# Patient Record
Sex: Male | Born: 1950 | Race: Black or African American | Hispanic: No | Marital: Married | Smoking: Never smoker
Health system: Southern US, Community
[De-identification: ages and names within clinical notes are randomized; demographics above are authoritative.]

## PROBLEM LIST (undated history)

## (undated) DIAGNOSIS — F419 Anxiety disorder, unspecified: Secondary | ICD-10-CM

## (undated) DIAGNOSIS — F4310 Post-traumatic stress disorder, unspecified: Secondary | ICD-10-CM

## (undated) DIAGNOSIS — E119 Type 2 diabetes mellitus without complications: Secondary | ICD-10-CM

## (undated) DIAGNOSIS — M549 Dorsalgia, unspecified: Secondary | ICD-10-CM

## (undated) DIAGNOSIS — I10 Essential (primary) hypertension: Secondary | ICD-10-CM

## (undated) HISTORY — PX: COLONOSCOPY: SHX174

## (undated) HISTORY — PX: EYE SURGERY: SHX253

---

## 2007-06-27 ENCOUNTER — Emergency Department (HOSPITAL_COMMUNITY): Admission: EM | Admit: 2007-06-27 | Discharge: 2007-06-27 | Payer: Self-pay | Admitting: Emergency Medicine

## 2011-02-19 ENCOUNTER — Emergency Department (HOSPITAL_COMMUNITY)
Admission: EM | Admit: 2011-02-19 | Discharge: 2011-02-19 | Disposition: A | Discharge status: Home / Self Care | Payer: Medicare Other | Attending: Emergency Medicine | Admitting: Emergency Medicine

## 2011-02-19 DIAGNOSIS — E119 Type 2 diabetes mellitus without complications: Secondary | ICD-10-CM | POA: Insufficient documentation

## 2011-02-19 DIAGNOSIS — R5381 Other malaise: Secondary | ICD-10-CM | POA: Insufficient documentation

## 2011-02-19 DIAGNOSIS — R55 Syncope and collapse: Secondary | ICD-10-CM | POA: Insufficient documentation

## 2011-02-19 DIAGNOSIS — I10 Essential (primary) hypertension: Secondary | ICD-10-CM | POA: Insufficient documentation

## 2011-02-19 DIAGNOSIS — R42 Dizziness and giddiness: Secondary | ICD-10-CM | POA: Insufficient documentation

## 2011-02-19 DIAGNOSIS — R5383 Other fatigue: Secondary | ICD-10-CM | POA: Insufficient documentation

## 2011-02-19 DIAGNOSIS — Z79899 Other long term (current) drug therapy: Secondary | ICD-10-CM | POA: Insufficient documentation

## 2019-10-03 ENCOUNTER — Emergency Department (HOSPITAL_COMMUNITY): Payer: Medicare Other

## 2019-10-03 ENCOUNTER — Emergency Department (HOSPITAL_COMMUNITY)
Admission: EM | Admit: 2019-10-03 | Discharge: 2019-10-03 | Disposition: A | Discharge status: Home / Self Care | Payer: Medicare Other | Attending: Emergency Medicine | Admitting: Emergency Medicine

## 2019-10-03 DIAGNOSIS — Y93I9 Activity, other involving external motion: Secondary | ICD-10-CM | POA: Diagnosis not present

## 2019-10-03 DIAGNOSIS — Y92411 Interstate highway as the place of occurrence of the external cause: Secondary | ICD-10-CM | POA: Diagnosis not present

## 2019-10-03 DIAGNOSIS — Y999 Unspecified external cause status: Secondary | ICD-10-CM | POA: Diagnosis not present

## 2019-10-03 DIAGNOSIS — S161XXA Strain of muscle, fascia and tendon at neck level, initial encounter: Secondary | ICD-10-CM

## 2019-10-03 DIAGNOSIS — M479 Spondylosis, unspecified: Secondary | ICD-10-CM | POA: Insufficient documentation

## 2019-10-03 DIAGNOSIS — M7918 Myalgia, other site: Secondary | ICD-10-CM

## 2019-10-03 DIAGNOSIS — S199XXA Unspecified injury of neck, initial encounter: Secondary | ICD-10-CM | POA: Diagnosis present
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
## 2019-10-03 MED ORDER — LIDOCAINE 5 % EX PTCH: 1.0000 | MEDICATED_PATCH | CUTANEOUS | 0 refills | Status: DC

## 2019-10-03 MED ORDER — HYDROCODONE-ACETAMINOPHEN 5-325 MG PO TABS: 1.0000 | ORAL_TABLET | ORAL | 0 refills | Status: DC | PRN

## 2019-10-03 NOTE — Discharge Instructions (Addendum)
Warm compresses to sore neck and back for 30 minutes at a time. Apply Lidoderm patches to back as needed as directed for pain. Recommend light activity today. Take Norco as needed as prescribed for pain, do not drive or operate machinery if taking Norco. Take Colace if you take Norco to prevent constipation. Follow-up with your doctor on Monday, return to ER for worsening or concerning symptoms.
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
## 2019-10-23 ENCOUNTER — Other Ambulatory Visit: Payer: Self-pay

## 2019-10-23 ENCOUNTER — Ambulatory Visit (INDEPENDENT_AMBULATORY_CARE_PROVIDER_SITE_OTHER): Payer: Medicare Other | Admitting: Family Medicine

## 2019-10-23 ENCOUNTER — Encounter: Payer: Self-pay | Admitting: Family Medicine

## 2019-10-23 DIAGNOSIS — M546 Pain in thoracic spine: Secondary | ICD-10-CM | POA: Diagnosis not present

## 2019-10-23 DIAGNOSIS — M542 Cervicalgia: Secondary | ICD-10-CM | POA: Diagnosis not present

## 2019-10-23 DIAGNOSIS — M545 Low back pain, unspecified: Secondary | ICD-10-CM
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
## 2019-11-04 ENCOUNTER — Ambulatory Visit (HOSPITAL_COMMUNITY)
Admission: RE | Admit: 2019-11-04 | Discharge: 2019-11-04 | Disposition: A | Discharge status: Home / Self Care | Payer: Medicare Other | Source: Ambulatory Visit | Attending: Family Medicine | Admitting: Family Medicine

## 2019-11-04 ENCOUNTER — Telehealth: Payer: Self-pay

## 2019-11-04 ENCOUNTER — Telehealth: Payer: Self-pay | Admitting: *Deleted

## 2019-11-04 ENCOUNTER — Other Ambulatory Visit: Payer: Self-pay

## 2019-11-04 ENCOUNTER — Telehealth: Payer: Self-pay | Admitting: Family Medicine

## 2019-11-04 DIAGNOSIS — M545 Low back pain, unspecified: Secondary | ICD-10-CM

## 2019-11-04 DIAGNOSIS — R29898 Other symptoms and signs involving the musculoskeletal system: Secondary | ICD-10-CM

## 2019-11-04 IMAGING — MR MR LUMBAR SPINE W/O CM
4 of 5 series · 19 of 48 positions shown · non-contrast
Comparison: None.

CLINICAL DATA: Back pain, minor trauma

EXAM:
MRI LUMBAR SPINE WITHOUT CONTRAST
TECHNIQUE: Multiplanar, multisequence MR imaging of the lumbar spine was
performed. No intravenous contrast was administered.

[Series 2: T2 · sagittal · 4.0mm · 0.55mm/px · 5 of 14 slices shown (1 of 2)]
[im 1/14]
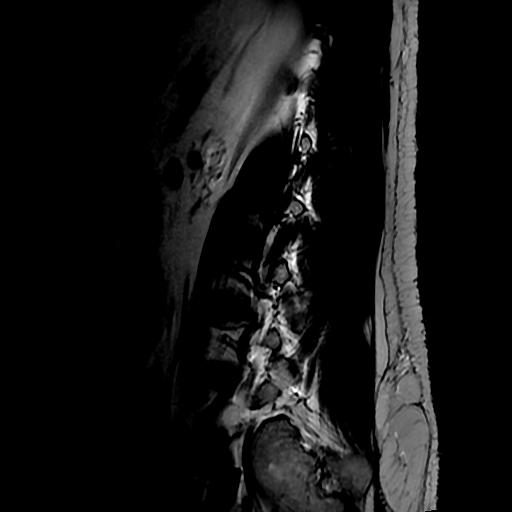
[im 4/14]
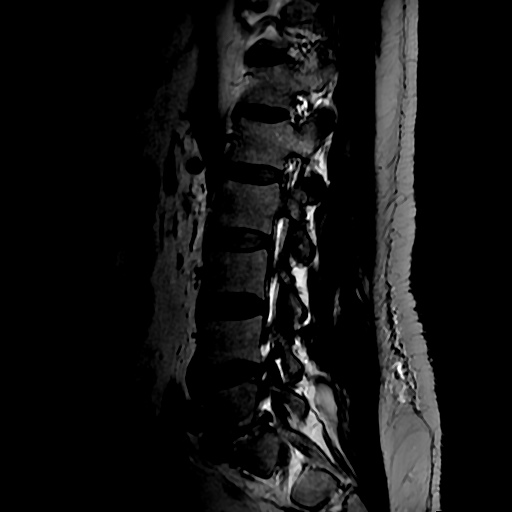
[im 7/14]
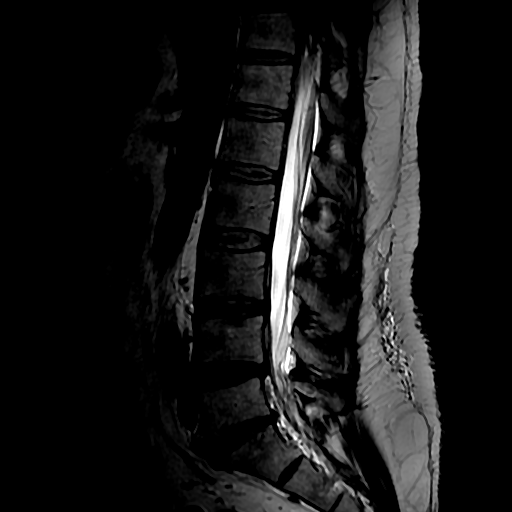
[im 10/14]
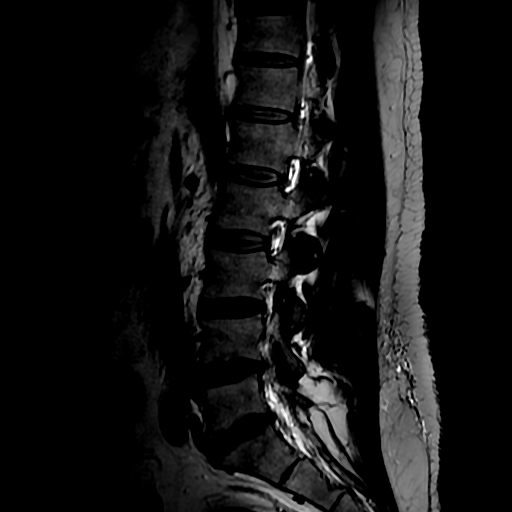
[im 14/14]
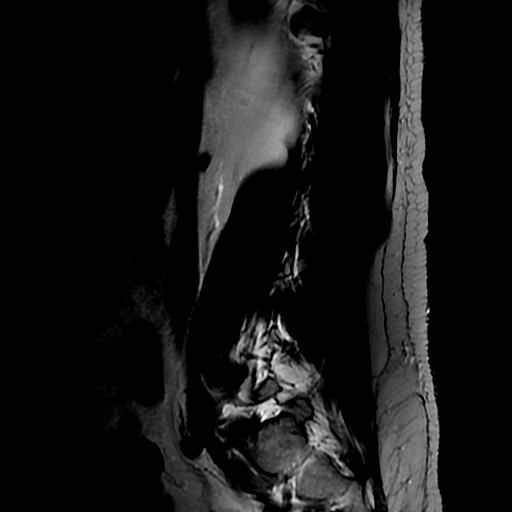

[Series 4: T1 · sagittal · 4.0mm · 0.55mm/px · 3 of 14 slices shown (1 of 2)]
[im 3/14]
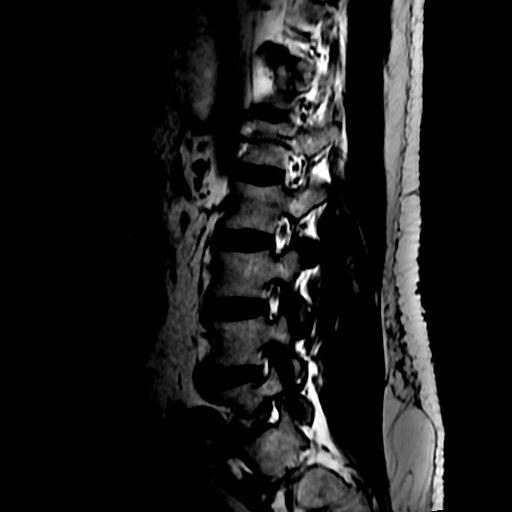
[im 8/14]
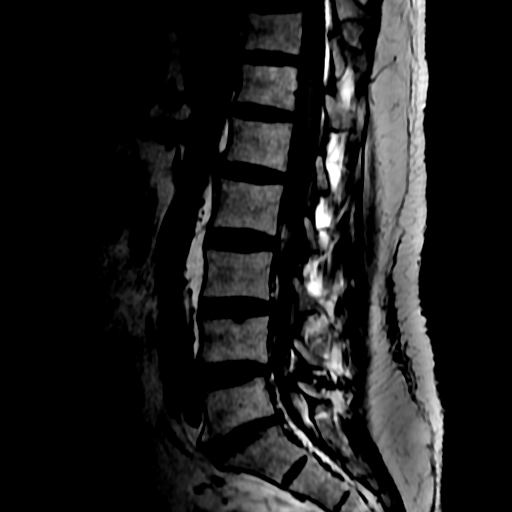
[im 14/14]
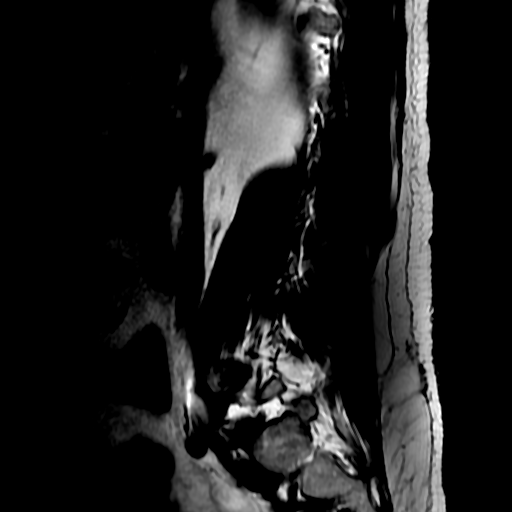

[Series 5: T2 · axial · 4.0mm · 0.39mm/px · z∈[-145,+30]mm · 8 of 40 slices shown (2 of 2)]
[im 3/40]
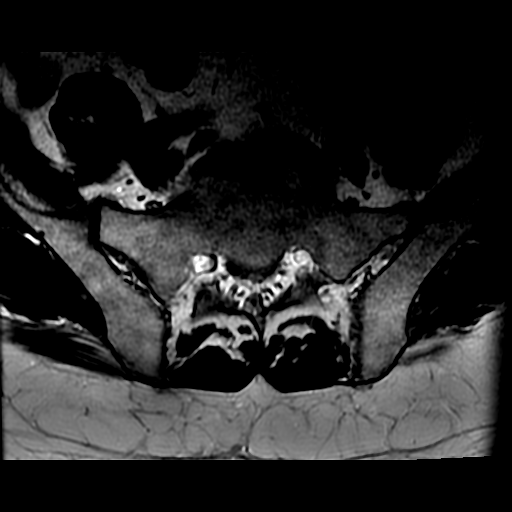
[im 6/40]
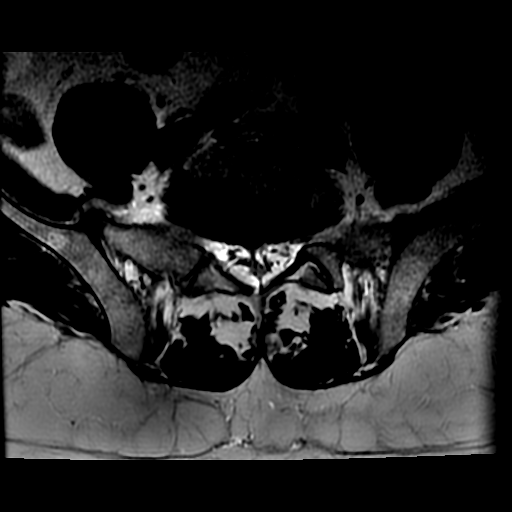
[im 8/40]
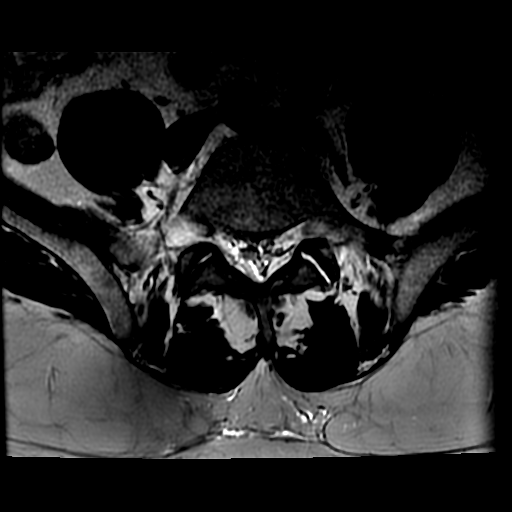
[im 14/40]
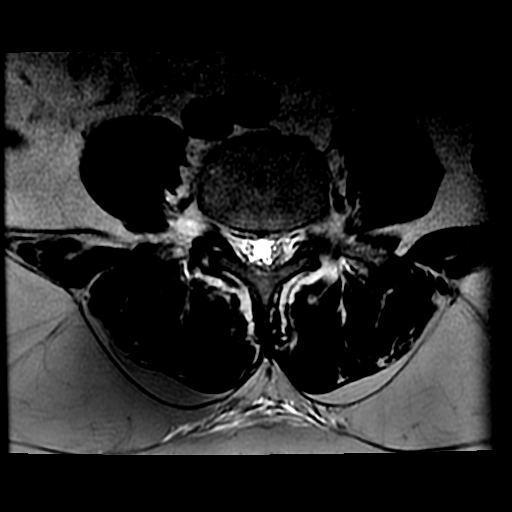
[im 19/40]
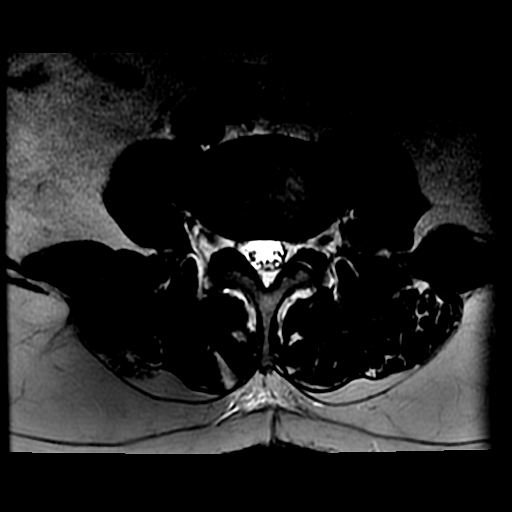
[im 21/40]
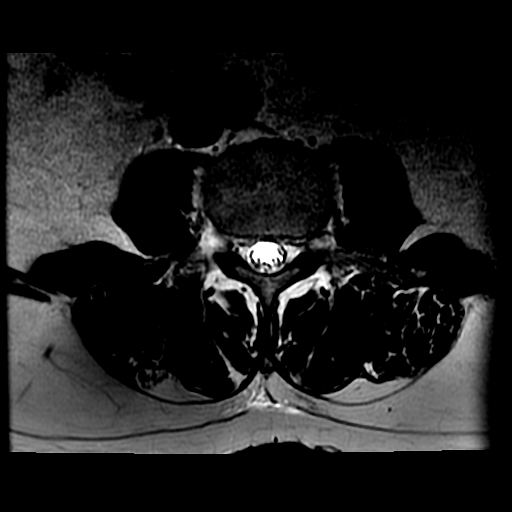
[im 24/40]
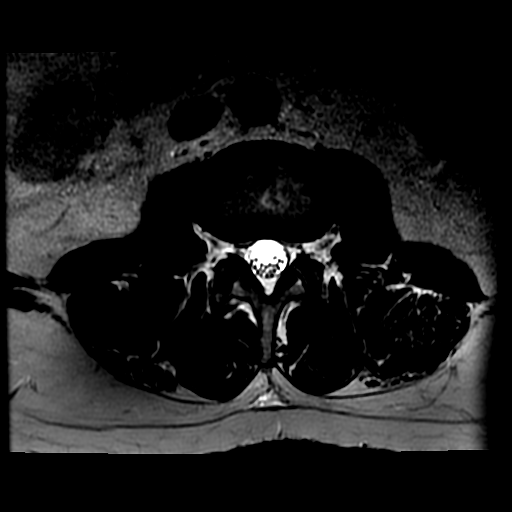
[im 34/40]
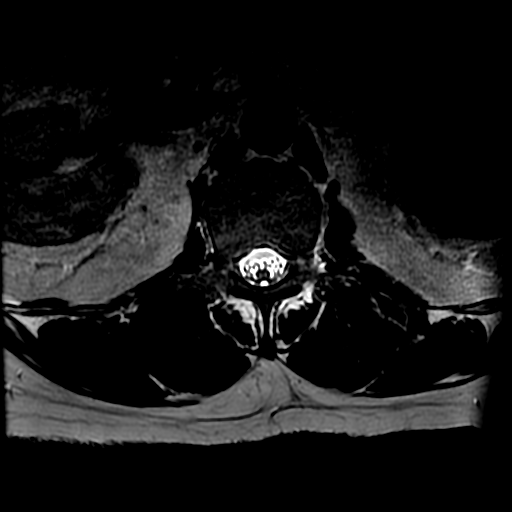

[Series 6: T1 · axial · 4.0mm · 0.39mm/px · z∈[-130,+30]mm · 3 of 40 slices shown (2 of 2)]
[im 6/40]
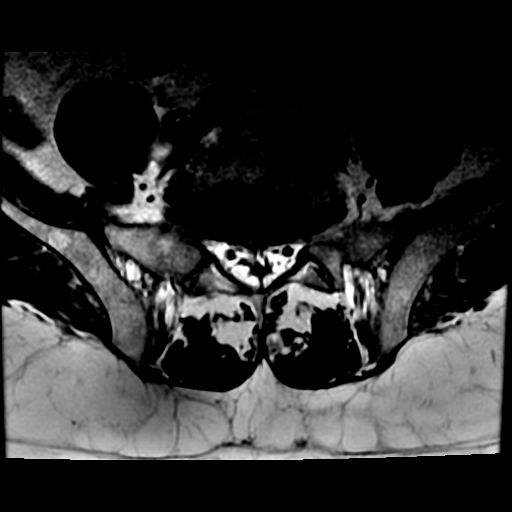
[im 21/40]
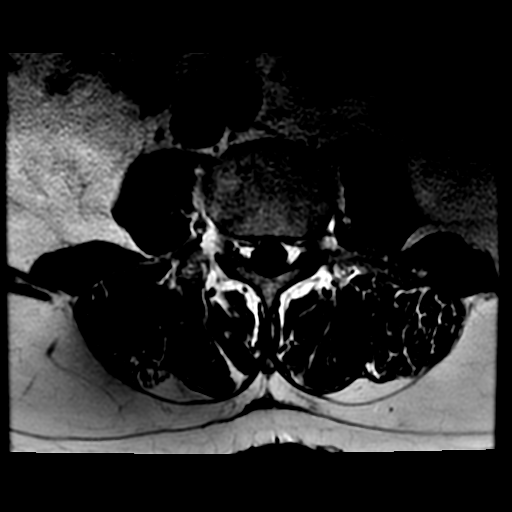
[im 34/40]
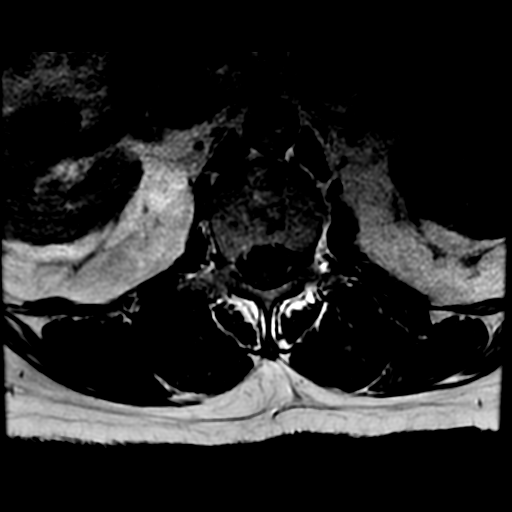

[19 of 48 positions shown; findings below may reference images not displayed]

FINDINGS: Segmentation:  Standard.

Alignment: Straightening of the lumbar lordosis. Anteroposterior
alignment is maintained.

Vertebrae: Vertebral body heights are maintained apart from
degenerative endplate irregularity at L4 and L5. There is no
significant marrow edema. No suspicious osseous lesion.

Conus medullaris and cauda equina: Conus extends to the L1-L2 level.
Conus and cauda equina appear normal.

Paraspinal and other soft tissues: Unremarkable.

Disc levels:

L1-L2:  No canal or foraminal stenosis.

L2-L3:  No canal or foraminal stenosis.

L3-L4:  No canal or foraminal stenosis.

L4-L5: Disc desiccation and mild disc height loss. Disc bulge and
mild facet arthropathy. No canal stenosis. Mild to moderate right
and mild left foraminal stenosis.

L5-S1: Disc desiccation and moderate disc height loss. Disc bulge
with endplate osteophytic ridging. Prominent circumferential
epidural fat partially effacing the thecal sac. No significant right
foraminal stenosis. Mild to moderate left foraminal stenosis.
IMPRESSION: Lower lumbar degenerative changes as detailed above.

## 2019-11-04 NOTE — Telephone Encounter (Signed)
I called the patient - see other message on this from today. 

## 2019-11-04 NOTE — Telephone Encounter (Signed)
FYI: Patient called again so I made his appt for MRI Review for Friday morning w/Dr Hilts. Please disregard last message until appt date. Thanks

## 2019-11-04 NOTE — Telephone Encounter (Signed)
I called the patient about his MRI results - see other message on this from today. I cancelled the appointment, as it is no longer needed.

## 2019-11-04 NOTE — Telephone Encounter (Signed)
I called the patient with his MRI results. He will continue chiropractic care and wait for a call with an appointment with neurology for NCS on his legs. Will cancel Friday's appointment with Dr. Junius Roads, as his MRI information was given today.

## 2019-11-04 NOTE — Telephone Encounter (Signed)
MRI shows some degenerative changes in the spine, but no pinched nerves.  This is surprising given his leg weakness, so I am going to refer him to neurology for nerve studies on his legs.

## 2019-11-04 NOTE — Telephone Encounter (Signed)
Patient called and wants someone to call him urgently, today. Patient states he needs to know what Dr Junius Roads want him to do. I advised patient that I could make him an appt for MRI Review since it was taken today. Patient would like for Dr Junius Roads to call him to know what to do from this point. Please advise.

## 2019-11-06 ENCOUNTER — Ambulatory Visit: Payer: Medicare Other | Admitting: Family Medicine

## 2019-11-06 ENCOUNTER — Other Ambulatory Visit: Payer: Self-pay

## 2019-11-06 DIAGNOSIS — R202 Paresthesia of skin: Secondary | ICD-10-CM

## 2019-11-09 ENCOUNTER — Telehealth: Payer: Self-pay | Admitting: Neurology

## 2019-11-09 NOTE — Telephone Encounter (Signed)
This is for your review, pt is scheduled for EMG tomorrow

## 2019-11-09 NOTE — Telephone Encounter (Signed)
Patient is calling in with the information for his doctor you needed. He said it's Dr. Luretha Rued at Phone [phone] and Fax [phone]. This is in regards to his steroid injection. Thanks!

## 2019-11-09 NOTE — Telephone Encounter (Signed)
Patient needs to share this with Dr. Junius Roads.  We are only performing the EMG.

## 2019-11-10 ENCOUNTER — Ambulatory Visit (INDEPENDENT_AMBULATORY_CARE_PROVIDER_SITE_OTHER): Payer: Medicare Other | Admitting: Neurology

## 2019-11-10 ENCOUNTER — Telehealth: Payer: Self-pay | Admitting: Family Medicine

## 2019-11-10 ENCOUNTER — Other Ambulatory Visit: Payer: Self-pay

## 2019-11-10 DIAGNOSIS — R202 Paresthesia of skin: Secondary | ICD-10-CM | POA: Diagnosis not present

## 2019-11-10 DIAGNOSIS — M5417 Radiculopathy, lumbosacral region: Secondary | ICD-10-CM

## 2019-11-10 NOTE — Telephone Encounter (Signed)
Will share with patient at EMG procedure today

## 2019-11-10 NOTE — Telephone Encounter (Signed)
Patient called to let Dr. Junius Roads know the name of the steroid injection in his back was called Dexamethasone.  CB#[phone].  Thank you.

## 2019-11-10 NOTE — Telephone Encounter (Signed)
Ok, thank you

## 2019-11-11 ENCOUNTER — Encounter: Payer: Self-pay | Admitting: Family Medicine

## 2019-11-11 ENCOUNTER — Encounter: Payer: Medicare Other | Admitting: Neurology

## 2019-11-11 ENCOUNTER — Telehealth: Payer: Self-pay

## 2019-11-11 ENCOUNTER — Ambulatory Visit (INDEPENDENT_AMBULATORY_CARE_PROVIDER_SITE_OTHER): Payer: Medicare Other | Admitting: Family Medicine

## 2019-11-11 DIAGNOSIS — M545 Low back pain, unspecified: Secondary | ICD-10-CM

## 2019-11-11 NOTE — Telephone Encounter (Signed)
Patient did come in for his appointment today.
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
## 2019-11-11 NOTE — Telephone Encounter (Signed)
I called and left a message on the patient's voice mail that we do not have the results from yesterday's nerve conduction studies yet (from Dr. Posey Pronto). There is no need for this afternoon's visit with Dr. Junius Roads, unless there is something else for which he would like to see him. I asked him to give me a call back, so I would know if I need to cancel this appointment or keep it.
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
## 2019-11-19 ENCOUNTER — Encounter: Payer: Self-pay | Admitting: *Deleted

## 2019-12-07 ENCOUNTER — Ambulatory Visit (INDEPENDENT_AMBULATORY_CARE_PROVIDER_SITE_OTHER): Payer: Medicare Other | Admitting: Physical Medicine and Rehabilitation

## 2019-12-07 ENCOUNTER — Encounter: Payer: Self-pay | Admitting: Physical Medicine and Rehabilitation

## 2019-12-07 ENCOUNTER — Other Ambulatory Visit: Payer: Self-pay

## 2019-12-07 ENCOUNTER — Ambulatory Visit: Payer: Self-pay

## 2019-12-07 VITALS — BP 157/101 | HR 66

## 2019-12-07 DIAGNOSIS — M5416 Radiculopathy, lumbar region: Secondary | ICD-10-CM

## 2019-12-07 MED ORDER — DEXAMETHASONE SODIUM PHOSPHATE 10 MG/ML IJ SOLN
10.0000 mg | Freq: Once | INTRAMUSCULAR | Status: AC
  Administered 2019-12-07: 16:00:00 10 mg

## 2019-12-07 MED ORDER — BETAMETHASONE SOD PHOS & ACET 6 (3-3) MG/ML IJ SUSP
12.0000 mg | Freq: Once | INTRAMUSCULAR | Status: AC
  Administered 2019-12-07: 08:00:00 12 mg

## 2019-12-07 NOTE — Progress Notes (Signed)
Numeric Pain Rating Scale and Functional Assessment Average Pain 10   In the last MONTH (on 0-10 scale) has pain interfered with the following?  1. General activity like being  able to carry out your everyday physical activities such as walking, climbing stairs, carrying groceries, or moving a chair?  Rating(10)   +Driver, -BT, -Dye Allergies. 
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
## 2019-12-07 NOTE — Addendum Note (Signed)
Addended by: Raymondo Band on: 12/07/2019 03:57 PM   Modules accepted: Orders, SmartSet

## 2019-12-07 NOTE — Procedures (Signed)
S1 Lumbosacral Transforaminal Epidural Steroid Injection - Sub-Pedicular Approach with Fluoroscopic Guidance   Patient: Tony [name]      Date of Birth: [birth year] MRN: [id number] PCP: System, Pcp Not In      Visit Date: 12/07/2019   Universal Protocol:    Date/Time: 12/21/208:18 AM  Consent Given By: the patient  Position:  PRONE  Additional Comments: Vital signs were monitored before and after the procedure. Patient was prepped and draped in the usual sterile fashion. The correct patient, procedure, and site was verified.   Injection Procedure Details:  Procedure Site One Meds Administered:  Meds ordered this encounter  Medications  . betamethasone acetate-betamethasone sodium phosphate (CELESTONE) injection 12 mg    Laterality: Left  Location/Site:  S1 Foramen   Needle size: 22 ga.  Needle type: Spinal  Needle Placement: Transforaminal  Findings:   -Comments: Excellent flow of contrast along the nerve and into the epidural space.  Epidurogram: Contrast epidurogram showed no nerve root cut off or restricted flow pattern.  Procedure Details: After squaring off the sacral end-plate to get a true AP view, the C-arm was positioned so that the best possible view of the S1 foramen was visualized. The soft tissues overlying this structure were infiltrated with 2-3 ml. of 1% Lidocaine without Epinephrine.    The spinal needle was inserted toward the target using a "trajectory" view along the fluoroscope beam.  Under AP and lateral visualization, the needle was advanced so it did not puncture dura. Biplanar projections were used to confirm position. Aspiration was confirmed to be negative for CSF and/or blood. A 1-2 ml. volume of Isovue-250 was injected and flow of contrast was noted at each level. Radiographs were obtained for documentation purposes.   After attaining the desired flow of contrast documented above, a 0.5 to 1.0 ml test dose of 0.25% Marcaine was injected  into each respective transforaminal space.  The patient was observed for 90 seconds post injection.  After no sensory deficits were reported, and normal lower extremity motor function was noted,   the above injectate was administered so that equal amounts of the injectate were placed at each foramen (level) into the transforaminal epidural space.   Additional Comments:  The patient tolerated the procedure well Dressing: Band-Aid with 2 x 2 sterile gauze    Post-procedure details: Patient was observed during the procedure. Post-procedure instructions were reviewed.  Patient left the clinic in stable condition.

## 2019-12-21 ENCOUNTER — Ambulatory Visit: Payer: Medicare Other | Admitting: Family Medicine

## 2019-12-22 ENCOUNTER — Other Ambulatory Visit: Payer: Self-pay

## 2019-12-22 ENCOUNTER — Ambulatory Visit (INDEPENDENT_AMBULATORY_CARE_PROVIDER_SITE_OTHER): Payer: Medicare Other | Admitting: Family Medicine

## 2019-12-22 ENCOUNTER — Encounter: Payer: Self-pay | Admitting: Family Medicine

## 2019-12-22 DIAGNOSIS — M79642 Pain in left hand: Secondary | ICD-10-CM

## 2019-12-22 DIAGNOSIS — M545 Low back pain, unspecified: Secondary | ICD-10-CM

## 2019-12-22 DIAGNOSIS — R29898 Other symptoms and signs involving the musculoskeletal system: Secondary | ICD-10-CM

## 2019-12-22 DIAGNOSIS — F4310 Post-traumatic stress disorder, unspecified: Secondary | ICD-10-CM

## 2019-12-22 MED ORDER — HYDROCODONE-ACETAMINOPHEN 5-325 MG PO TABS: 1.0000 | ORAL_TABLET | ORAL | 0 refills | Status: DC | PRN

## 2019-12-22 MED ORDER — LIDOCAINE 5 % EX PTCH: 1.0000 | MEDICATED_PATCH | CUTANEOUS | 6 refills | Status: DC
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
## 2019-12-28 ENCOUNTER — Encounter: Payer: Self-pay | Admitting: *Deleted

## 2019-12-30 ENCOUNTER — Ambulatory Visit
Admission: RE | Admit: 2019-12-30 | Discharge: 2019-12-30 | Disposition: A | Discharge status: Home / Self Care | Payer: Medicare Other | Source: Ambulatory Visit | Attending: Family Medicine | Admitting: Family Medicine

## 2019-12-30 ENCOUNTER — Other Ambulatory Visit: Payer: Self-pay

## 2019-12-30 ENCOUNTER — Telehealth: Payer: Self-pay | Admitting: Family Medicine

## 2019-12-30 DIAGNOSIS — M79642 Pain in left hand: Secondary | ICD-10-CM
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
## 2019-12-30 NOTE — Telephone Encounter (Signed)
I advised the patient of his MRI results. He would still like to come in and see Dr. Prince Rome in the morning, anyway. I will give him his hand therapy referral at that time.

## 2019-12-30 NOTE — Telephone Encounter (Signed)
I reviewed his left thumb MRI scan which looks good, no sign of ligament tear or occult fracture.  I will recommend referral to hand therapy.

## 2019-12-31 ENCOUNTER — Ambulatory Visit (INDEPENDENT_AMBULATORY_CARE_PROVIDER_SITE_OTHER): Payer: Medicare Other | Admitting: Family Medicine

## 2019-12-31 ENCOUNTER — Encounter: Payer: Self-pay | Admitting: Family Medicine

## 2019-12-31 DIAGNOSIS — S63602D Unspecified sprain of left thumb, subsequent encounter: Secondary | ICD-10-CM | POA: Diagnosis not present
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
## 2020-01-05 ENCOUNTER — Encounter: Payer: Self-pay | Admitting: Physical Medicine and Rehabilitation

## 2020-01-05 ENCOUNTER — Ambulatory Visit: Payer: Self-pay

## 2020-01-05 ENCOUNTER — Other Ambulatory Visit: Payer: Self-pay

## 2020-01-05 ENCOUNTER — Ambulatory Visit (INDEPENDENT_AMBULATORY_CARE_PROVIDER_SITE_OTHER): Payer: Medicare Other | Admitting: Physical Medicine and Rehabilitation

## 2020-01-05 VITALS — BP 162/95 | HR 83

## 2020-01-05 DIAGNOSIS — M5116 Intervertebral disc disorders with radiculopathy, lumbar region: Secondary | ICD-10-CM | POA: Diagnosis not present

## 2020-01-05 DIAGNOSIS — M5416 Radiculopathy, lumbar region: Secondary | ICD-10-CM

## 2020-01-05 MED ORDER — DEXAMETHASONE SODIUM PHOSPHATE 10 MG/ML IJ SOLN
15.0000 mg | Freq: Once | INTRAMUSCULAR | Status: AC
  Administered 2020-01-05: 14:00:00 15 mg

## 2020-01-05 NOTE — Progress Notes (Signed)
Numeric Pain Rating Scale and Functional Assessment Average Pain 10   In the last MONTH (on 0-10 scale) has pain interfered with the following?  1. General activity like being  able to carry out your everyday physical activities such as walking, climbing stairs, carrying groceries, or moving a chair?  Rating(10)   +Driver, -BT, -Dye Allergies. 

## 2020-01-06 NOTE — Progress Notes (Signed)
Tony [name] - 69 y.o. male MRN [id number]  Date of birth: [birth year]  Office Visit Note: Visit Date: 01/05/2020 PCP: System, Pcp Not In Referred by: [name], MD  Subjective: Chief Complaint  Patient presents with  . Lower Back - Pain  . Left Leg - Pain   HPI: Tony [name] is a 69 y.o. male who comes in today For planned repeat left S1 transforaminal epidural steroid injection.  Patient is followed by Dr. [name].  He still continues to have 10 out of 10 low back and left radicular leg pain down the back of the leg.  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  Electrodiagnostic study by Dr. Narda Amber showed chronic S1 radiculopathy.  Patient has received a lot of pain management care in [place] where he lives part of the time.  Last injection did seem to help but it was very short-lived.  I will repeat the injection today.  He request dexamethasone.  He is an insulin-dependent diabetic and we did counsel him on increased blood sugar.  If this injection is not long-lasting I am not sure I have anything else from an injection standpoint to offer particularly with epidural lipomatosis.  I have seen insulin-dependent diabetics get this increased accumulation of epidural fat tissue and almost a lipodystrophy.  Encourage weight loss may be have to consider surgical approach.  ROS Otherwise per HPI.  Assessment & Plan: Visit Diagnoses:  1. Lumbar radiculopathy   2. Radiculopathy due to lumbar intervertebral disc disorder     Plan: No additional findings.   Meds & Orders:  Meds ordered this encounter  Medications  . dexamethasone (DECADRON) injection 15 mg    Orders Placed This Encounter  Procedures  . XR C-ARM NO REPORT  . Epidural Steroid injection    Follow-up: Return for visit to requesting physician as needed.   Procedures: No procedures  performed  S1 Lumbosacral Transforaminal Epidural Steroid Injection - Sub-Pedicular Approach with Fluoroscopic Guidance   Patient: Tony [name]      Date of Birth: Apr 05, 1951 MRN: [id number] PCP: System, Pcp Not In      Visit Date: 01/05/2020   Universal Protocol:    Date/Time: 01/20/215:55 AM  Consent Given By: the patient  Position:  PRONE  Additional Comments: Vital signs were monitored before and after the procedure. Patient was prepped and draped in the usual sterile fashion. The correct patient, procedure, and site was verified.   Injection Procedure Details:  Procedure Site One Meds Administered:  Meds ordered this encounter  Medications  . dexamethasone (DECADRON) injection 15 mg    Laterality: Left  Location/Site:  S1 Foramen   Needle size: 22 ga.  Needle type: Spinal  Needle Placement: Transforaminal  Findings:   -Comments: Excellent flow of contrast along the nerve and into the epidural space.  Epidurogram: Contrast epidurogram showed no nerve root cut off or restricted flow pattern.  Procedure Details: After squaring off the sacral end-plate to get a true AP view, the C-arm was positioned so that the best possible view of the S1 foramen was visualized. The soft tissues overlying this structure were infiltrated with 2-3 ml. of 1% Lidocaine without Epinephrine.    The spinal needle was inserted toward the target using a "trajectory" view along the fluoroscope beam.  Under AP and lateral visualization, the needle was advanced so it did not puncture dura. Biplanar projections were used to  confirm position. Aspiration was confirmed to be negative for CSF and/or blood. A 1-2 ml. volume of Isovue-250 was injected and flow of contrast was noted at each level. Radiographs were obtained for documentation purposes.   After attaining the desired flow of contrast documented above, a 0.5 to 1.0 ml test dose of 0.25% Marcaine was injected into each respective  transforaminal space.  The patient was observed for 90 seconds post injection.  After no sensory deficits were reported, and normal lower extremity motor function was noted,   the above injectate was administered so that equal amounts of the injectate were placed at each foramen (level) into the transforaminal epidural space.   Additional Comments:  The patient tolerated the procedure well Dressing: Band-Aid with 2 x 2 sterile gauze    Post-procedure details: Patient was observed during the procedure. Post-procedure instructions were reviewed.  Patient left the clinic in stable condition.     Clinical History: NCV & EMG Findings: 11/10/2019 Extensive electrodiagnostic testing of the right lower extremity and additional studies of the left shows:  1. Bilateral sural and superficial peroneal sensory responses are within normal limits. 2. Bilateral peroneal and right tibial motor responses are within normal limits.  Left tibial motor response shows reduced amplitude (2.5 mV). 3. Bilateral tibial H reflex studies are within normal limits. 4. 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  Impression: 1. 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 2. There is no evidence of a large fiber sensorimotor neuropathy affecting the lower extremities.    ___________________________ Nita Sickle, DO  --------- MRI LUMBAR SPINE WITHOUT CONTRAST    TECHNIQUE:  Multiplanar, multisequence MR imaging of the lumbar spine was  performed. No intravenous contrast was administered.    COMPARISON: None.    FINDINGS:  Segmentation: Standard.    Alignment: Straightening of the lumbar lordosis. Anteroposterior  alignment is maintained.    Vertebrae: Vertebral body heights are maintained apart from  degenerative endplate irregularity at L4 and L5. There is no  significant marrow edema. No suspicious  osseous lesion.    Conus medullaris and cauda equina: Conus extends to the L1-L2 level.  Conus and cauda equina appear normal.    Paraspinal and other soft tissues: Unremarkable.    Disc levels:    L1-L2: No canal or foraminal stenosis.    L2-L3: No canal or foraminal stenosis.    L3-L4: No canal or foraminal stenosis.    L4-L5: Disc desiccation and mild disc height loss. Disc bulge and  mild facet arthropathy. No canal stenosis. Mild to moderate right  and mild left foraminal stenosis.    L5-S1: Disc desiccation and moderate disc height loss. Disc bulge  with endplate osteophytic ridging. Prominent circumferential  epidural fat partially effacing the thecal sac. No significant right  foraminal stenosis. Mild to moderate left foraminal stenosis.    IMPRESSION:  Lower lumbar degenerative changes as detailed above.      Electronically Signed  By: Guadlupe Spanish M.D.  On: 11/04/2019 13:05   He has no history on file for tobacco. No results for input(s): HGBA1C, LABURIC in the last 8760 hours.  Objective:  VS:  HT:    WT:   BMI:     BP:(!) 162/95  HR:83bpm  TEMP: ( )  RESP:  Physical Exam  Ortho Exam Imaging: XR C-ARM NO REPORT  Result Date: 01/05/2020 Please see Notes tab for imaging impression.   Past Medical/Family/Surgical/Social History: Medications &  Allergies reviewed per EMR, new medications updated. There are no problems to display for this patient.  History reviewed. No pertinent past medical history. History reviewed. No pertinent family history. History reviewed. No pertinent surgical history. Social History   Occupational History  . Not on file  Tobacco Use  . Smoking status: Not on file  Substance and Sexual Activity  . Alcohol use: Not on file  . Drug use: Not on file  . Sexual activity: Not on file

## 2020-01-06 NOTE — Procedures (Signed)
S1 Lumbosacral Transforaminal Epidural Steroid Injection - Sub-Pedicular Approach with Fluoroscopic Guidance   Patient: Tony [name]      Date of Birth: June 05, 1951 MRN: [id number] PCP: System, Pcp Not In      Visit Date: 01/05/2020   Universal Protocol:    Date/Time: 01/20/215:55 AM  Consent Given By: the patient  Position:  PRONE  Additional Comments: Vital signs were monitored before and after the procedure. Patient was prepped and draped in the usual sterile fashion. The correct patient, procedure, and site was verified.   Injection Procedure Details:  Procedure Site One Meds Administered:  Meds ordered this encounter  Medications  . dexamethasone (DECADRON) injection 15 mg    Laterality: Left  Location/Site:  S1 Foramen   Needle size: 22 ga.  Needle type: Spinal  Needle Placement: Transforaminal  Findings:   -Comments: Excellent flow of contrast along the nerve and into the epidural space.  Epidurogram: Contrast epidurogram showed no nerve root cut off or restricted flow pattern.  Procedure Details: After squaring off the sacral end-plate to get a true AP view, the C-arm was positioned so that the best possible view of the S1 foramen was visualized. The soft tissues overlying this structure were infiltrated with 2-3 ml. of 1% Lidocaine without Epinephrine.    The spinal needle was inserted toward the target using a "trajectory" view along the fluoroscope beam.  Under AP and lateral visualization, the needle was advanced so it did not puncture dura. Biplanar projections were used to confirm position. Aspiration was confirmed to be negative for CSF and/or blood. A 1-2 ml. volume of Isovue-250 was injected and flow of contrast was noted at each level. Radiographs were obtained for documentation purposes.   After attaining the desired flow of contrast documented above, a 0.5 to 1.0 ml test dose of 0.25% Marcaine was injected into each respective transforaminal  space.  The patient was observed for 90 seconds post injection.  After no sensory deficits were reported, and normal lower extremity motor function was noted,   the above injectate was administered so that equal amounts of the injectate were placed at each foramen (level) into the transforaminal epidural space.   Additional Comments:  The patient tolerated the procedure well Dressing: Band-Aid with 2 x 2 sterile gauze    Post-procedure details: Patient was observed during the procedure. Post-procedure instructions were reviewed.  Patient left the clinic in stable condition.

## 2020-01-10 ENCOUNTER — Other Ambulatory Visit: Payer: Self-pay

## 2020-01-10 ENCOUNTER — Emergency Department (HOSPITAL_COMMUNITY): Payer: Medicare Other

## 2020-01-10 ENCOUNTER — Emergency Department (HOSPITAL_COMMUNITY)
Admission: EM | Admit: 2020-01-10 | Discharge: 2020-01-10 | Disposition: A | Discharge status: Home / Self Care | Payer: Medicare Other | Attending: Emergency Medicine | Admitting: Emergency Medicine

## 2020-01-10 ENCOUNTER — Encounter (HOSPITAL_COMMUNITY): Payer: Self-pay | Admitting: Emergency Medicine

## 2020-01-10 DIAGNOSIS — Z7984 Long term (current) use of oral hypoglycemic drugs: Secondary | ICD-10-CM | POA: Diagnosis not present

## 2020-01-10 DIAGNOSIS — E669 Obesity, unspecified: Secondary | ICD-10-CM | POA: Diagnosis not present

## 2020-01-10 DIAGNOSIS — E1165 Type 2 diabetes mellitus with hyperglycemia: Secondary | ICD-10-CM | POA: Diagnosis not present

## 2020-01-10 DIAGNOSIS — Z6820 Body mass index (BMI) 20.0-20.9, adult: Secondary | ICD-10-CM | POA: Insufficient documentation

## 2020-01-10 DIAGNOSIS — I10 Essential (primary) hypertension: Secondary | ICD-10-CM | POA: Diagnosis not present

## 2020-01-10 DIAGNOSIS — R739 Hyperglycemia, unspecified: Secondary | ICD-10-CM

## 2020-01-10 DIAGNOSIS — Z79899 Other long term (current) drug therapy: Secondary | ICD-10-CM | POA: Diagnosis not present

## 2020-01-10 DIAGNOSIS — R42 Dizziness and giddiness: Secondary | ICD-10-CM | POA: Diagnosis present

## 2020-01-10 HISTORY — DX: Dorsalgia, unspecified: M54.9

## 2020-01-10 HISTORY — DX: Type 2 diabetes mellitus without complications: E11.9

## 2020-01-10 HISTORY — DX: Essential (primary) hypertension: I10

## 2020-01-10 LAB — CBC WITH DIFFERENTIAL/PLATELET
Abs Immature Granulocytes: 0.04 10*3/uL (ref 0.00–0.07)
Basophils Absolute: 0.1 10*3/uL (ref 0.0–0.1)
Basophils Relative: 1 %
Eosinophils Absolute: 0.1 10*3/uL (ref 0.0–0.5)
Eosinophils Relative: 2 %
HCT: 50.6 % (ref 39.0–52.0)
Hemoglobin: 17.3 g/dL — ABNORMAL HIGH (ref 13.0–17.0)
Immature Granulocytes: 1 %
Lymphocytes Relative: 36 %
Lymphs Abs: 1.7 10*3/uL (ref 0.7–4.0)
MCH: 29.8 pg (ref 26.0–34.0)
MCHC: 34.2 g/dL (ref 30.0–36.0)
MCV: 87.1 fL (ref 80.0–100.0)
Monocytes Absolute: 0.5 10*3/uL (ref 0.1–1.0)
Monocytes Relative: 9 %
Neutro Abs: 2.5 10*3/uL (ref 1.7–7.7)
Neutrophils Relative %: 51 %
Platelets: 235 10*3/uL (ref 150–400)
RBC: 5.81 MIL/uL (ref 4.22–5.81)
RDW: 13.3 % (ref 11.5–15.5)
WBC: 4.9 10*3/uL (ref 4.0–10.5)
nRBC: 0 % (ref 0.0–0.2)

## 2020-01-10 LAB — BASIC METABOLIC PANEL
Anion gap: 9 (ref 5–15)
BUN: 15 mg/dL (ref 8–23)
CO2: 26 mmol/L (ref 22–32)
Calcium: 9 mg/dL (ref 8.9–10.3)
Chloride: 102 mmol/L (ref 98–111)
Creatinine, Ser: 1.16 mg/dL (ref 0.61–1.24)
GFR calc Af Amer: >60 mL/min (ref >60)
GFR calc non Af Amer: >60 mL/min (ref >60)
Glucose, Bld: 246 mg/dL — ABNORMAL HIGH (ref 70–99)
Potassium: 4.8 mmol/L (ref 3.5–5.1)
Sodium: 137 mmol/L (ref 135–145)

## 2020-01-10 LAB — CBG MONITORING, ED: Glucose-Capillary: 223 mg/dL — ABNORMAL HIGH (ref 70–99)

## 2020-01-10 IMAGING — DX DG CHEST 1V PORT
1 series · 1 of 1 positions shown · non-contrast
Comparison: None.

CLINICAL DATA: Episode of dizziness today.

EXAM:
PORTABLE CHEST 1 VIEW

[chest ap]
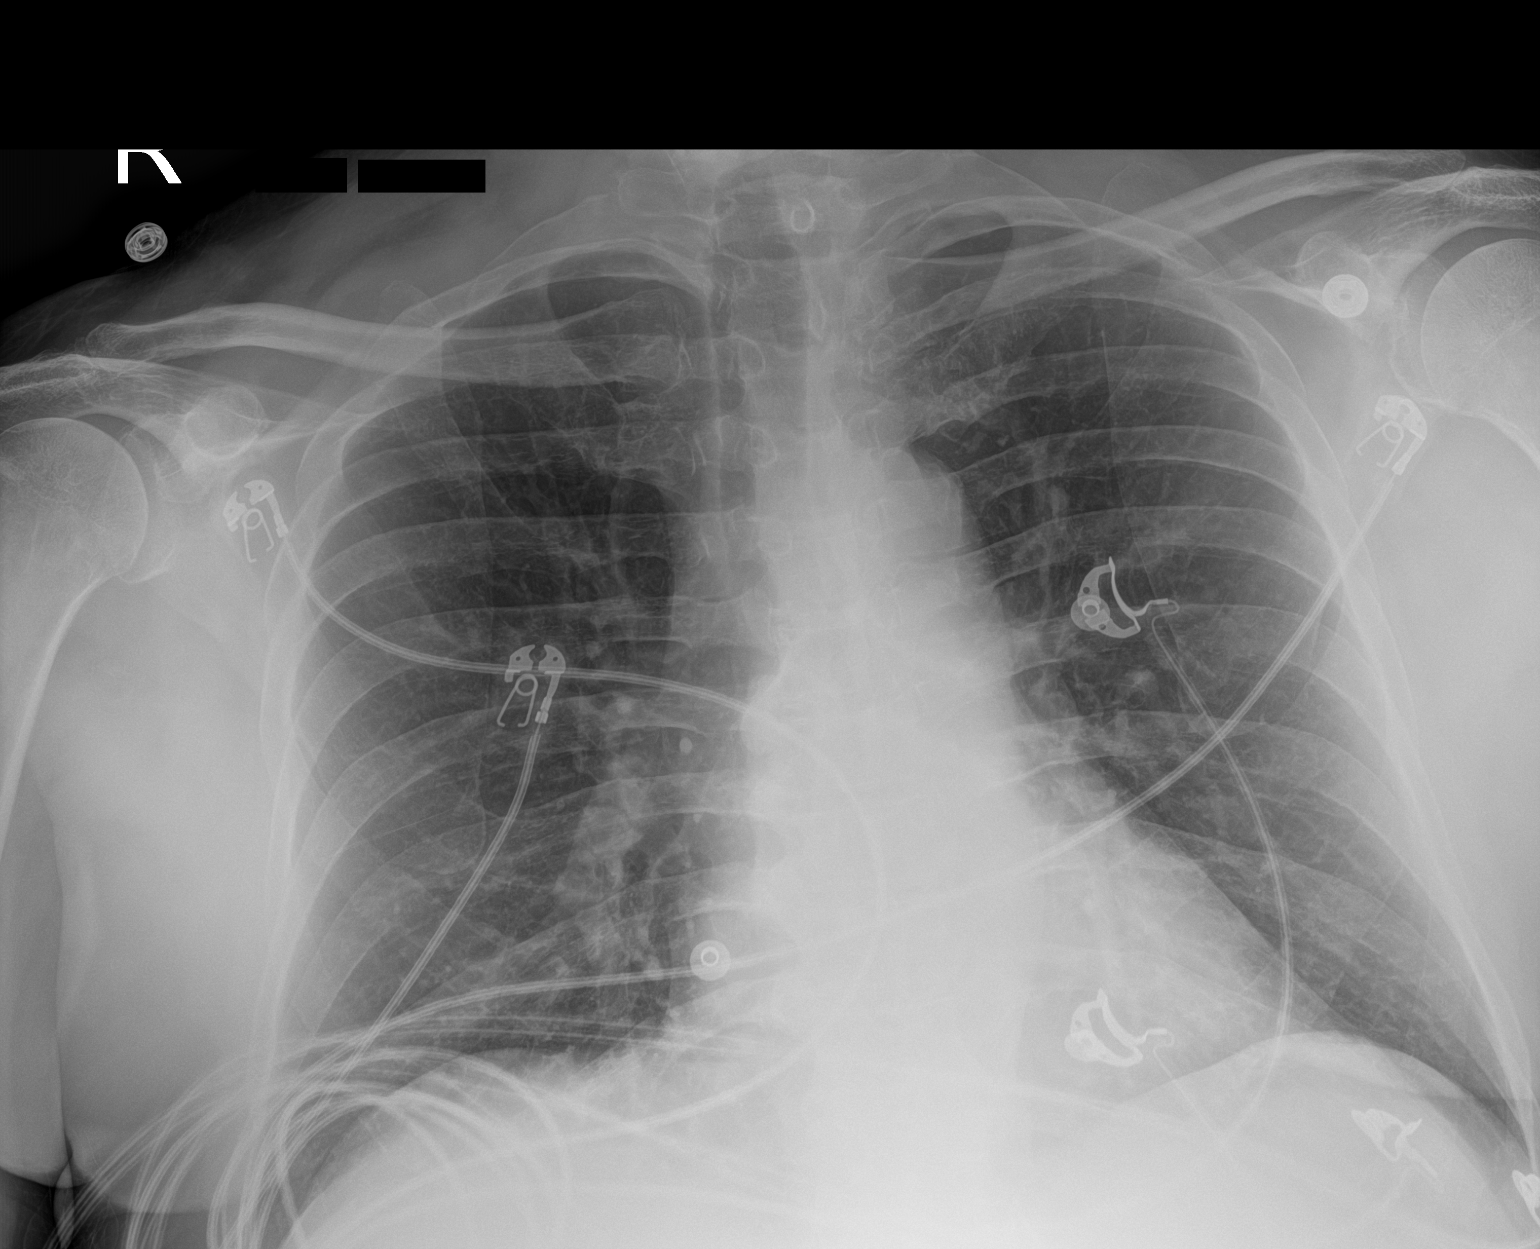

[1 of 1 positions shown; findings below may reference images not displayed]

FINDINGS: Lungs are clear. Heart size is normal. No pneumothorax or pleural
fluid. No acute or focal bony abnormality.
IMPRESSION: Negative chest.

## 2020-01-10 MED ORDER — SODIUM CHLORIDE 0.9 % IV BOLUS
500.0000 mL | Freq: Once | INTRAVENOUS | Status: AC
  Administered 2020-01-10: 500 mL via INTRAVENOUS
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
## 2020-01-10 NOTE — ED Notes (Signed)
X-ray at bedside

## 2020-01-10 NOTE — Discharge Instructions (Addendum)
Take your medications as prescribed.  Follow-up with your providers.  Return to ER for new or worsening symptoms.
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
## 2020-01-13 ENCOUNTER — Telehealth: Payer: Self-pay | Admitting: Family Medicine

## 2020-01-13 NOTE — Telephone Encounter (Signed)
FYI

## 2020-01-13 NOTE — Telephone Encounter (Signed)
Patient called to let Dr. Prince Rome and Dr. Alvester Morin know that he saw Dr. Hollice Espy today and Dr Hollice Espy (Chiropractor) found a bulging disc in the middle of his back. Patient said the swelling went down some. Patient has an appointment 01/19/2020 with Dr. Prince Rome. The number to contact patient if needed is [phone]

## 2020-01-19 ENCOUNTER — Encounter: Payer: Self-pay | Admitting: Family Medicine

## 2020-01-19 ENCOUNTER — Other Ambulatory Visit: Payer: Self-pay

## 2020-01-19 ENCOUNTER — Ambulatory Visit (INDEPENDENT_AMBULATORY_CARE_PROVIDER_SITE_OTHER): Payer: Medicare Other | Admitting: Family Medicine

## 2020-01-19 DIAGNOSIS — M546 Pain in thoracic spine: Secondary | ICD-10-CM

## 2020-01-19 DIAGNOSIS — M545 Low back pain, unspecified: Secondary | ICD-10-CM
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
## 2020-02-16 ENCOUNTER — Telehealth: Payer: Self-pay | Admitting: Physical Medicine and Rehabilitation

## 2020-02-16 ENCOUNTER — Ambulatory Visit (INDEPENDENT_AMBULATORY_CARE_PROVIDER_SITE_OTHER): Payer: Medicare Other | Admitting: Family Medicine

## 2020-02-16 ENCOUNTER — Encounter: Payer: Self-pay | Admitting: Family Medicine

## 2020-02-16 ENCOUNTER — Other Ambulatory Visit: Payer: Self-pay

## 2020-02-16 ENCOUNTER — Telehealth: Payer: Self-pay | Admitting: Family Medicine

## 2020-02-16 ENCOUNTER — Telehealth: Payer: Self-pay

## 2020-02-16 DIAGNOSIS — M545 Low back pain, unspecified: Secondary | ICD-10-CM

## 2020-02-16 DIAGNOSIS — F4310 Post-traumatic stress disorder, unspecified: Secondary | ICD-10-CM

## 2020-02-16 DIAGNOSIS — S63602D Unspecified sprain of left thumb, subsequent encounter: Secondary | ICD-10-CM

## 2020-02-16 MED ORDER — HYDROCODONE-ACETAMINOPHEN 5-325 MG PO TABS: 1.0000 | ORAL_TABLET | ORAL | 0 refills | Status: DC | PRN

## 2020-02-16 NOTE — Telephone Encounter (Signed)
I spoke with the pt and informed him. He stated understanding

## 2020-02-16 NOTE — Progress Notes (Signed)
Pt states pain is in the middle of the shoulder blades and in lower back down both legs

## 2020-02-16 NOTE — Telephone Encounter (Signed)
Per dr. Prince Rome appointment for tomorrow is cancelled

## 2020-02-16 NOTE — Telephone Encounter (Signed)
Pt called in said he was here this morning and dr.hilts sent him to hand specialist, and he has some concerns about the fluid in his hand and would like to discuss it with dr.hilts. Please give him a call  [phone]

## 2020-02-16 NOTE — Telephone Encounter (Signed)
Patient called asked for a call back to schedule an appointment with Dr. Alvester Morin. Patient asked to be scheduled on a Tuesday or Thursday at 8:00am. The number to contact patient is [phone]
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
## 2020-02-16 NOTE — Telephone Encounter (Signed)
Per Dr. Prince Rome, need to r/s thumb injection until 2 weeks after epidural
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
## 2020-02-16 NOTE — Telephone Encounter (Signed)
Called, no answer. Will try later.  

## 2020-02-17 ENCOUNTER — Ambulatory Visit: Payer: Medicare Other | Admitting: Family Medicine

## 2020-03-07 ENCOUNTER — Other Ambulatory Visit: Payer: Self-pay

## 2020-03-07 ENCOUNTER — Ambulatory Visit (INDEPENDENT_AMBULATORY_CARE_PROVIDER_SITE_OTHER): Payer: Medicare Other | Admitting: Physical Medicine and Rehabilitation

## 2020-03-07 ENCOUNTER — Ambulatory Visit: Payer: Self-pay

## 2020-03-07 ENCOUNTER — Encounter: Payer: Self-pay | Admitting: Physical Medicine and Rehabilitation

## 2020-03-07 VITALS — BP 139/81 | HR 80

## 2020-03-07 DIAGNOSIS — M5416 Radiculopathy, lumbar region: Secondary | ICD-10-CM

## 2020-03-07 MED ORDER — METHYLPREDNISOLONE ACETATE 80 MG/ML IJ SUSP: 40.0000 mg | Freq: Once | INTRAMUSCULAR | Status: DC

## 2020-03-07 NOTE — Progress Notes (Signed)
Tony [name] - 69 y.o. male MRN [id number]  Date of birth: [birth year]  Office Visit Note: Visit Date: 03/07/2020 PCP: System, Pcp Not In Referred by: [name], MD  Subjective: Chief Complaint  Patient presents with  . Lower Back - Pain  . Right Leg - Pain  . Left Leg - Pain  . Right Foot - Pain  . Left Foot - Pain   HPI:  Tony [name] is a 69 y.o. male who comes in today For third and final epidural injection for low back and bilateral hip and leg pain.  We have been completing just left-sided injection for more left-sided pain but now with bilateral complaints.  This is at the request of Dr. Legrand Como hilts.  Patient will follow up with him as needed.  Also after last injection patient did have an episode of hypoglycemia even though we did tell him to be watchful of this.  We did talk to him again about this today.  ROS Otherwise per HPI.  Assessment & Plan: Visit Diagnoses:  1. Lumbar radiculopathy     Plan: No additional findings.   Meds & Orders:  Meds ordered this encounter  Medications  . DISCONTD: methylPREDNISolone acetate (DEPO-MEDROL) injection 40 mg    Orders Placed This Encounter  Procedures  . XR C-ARM NO REPORT  . Epidural Steroid injection    Follow-up: Return for visit to requesting physician as needed.   Procedures: No procedures performed  Lumbosacral Transforaminal Epidural Steroid Injection - Sub-Pedicular Approach with Fluoroscopic Guidance  Patient: Tony [name]      Date of Birth: 09-21-1951 MRN: [id number] PCP: System, Pcp Not In      Visit Date: 03/07/2020   Universal Protocol:    Date/Time: 03/07/2020  Consent Given By: the patient  Position: PRONE  Additional Comments: Vital signs were monitored before and after the procedure. Patient was prepped and draped in the usual sterile fashion. The correct patient, procedure, and site was verified.   Injection Procedure Details:  Procedure Site One Meds Administered:  Meds  ordered this encounter  Medications  . methylPREDNISolone acetate (DEPO-MEDROL) injection 40 mg    Laterality: Bilateral  Location/Site:  S1-2  Needle size: 22 G  Needle type: Spinal  Needle Placement: Transforaminal  Findings:    -Comments: Excellent flow of contrast along the nerve and into the epidural space.  Procedure Details: After squaring off the end-plates to get a true AP view, the C-arm was positioned so that an oblique view of the foramen as noted above was visualized. The target area is just inferior to the "nose of the scotty dog" or sub pedicular. The soft tissues overlying this structure were infiltrated with 2-3 ml. of 1% Lidocaine without Epinephrine.  The spinal needle was inserted toward the target using a "trajectory" view along the fluoroscope beam.  Under AP and lateral visualization, the needle was advanced so it did not puncture dura and was located close the 6 O'Clock position of the pedical in AP tracterory. Biplanar projections were used to confirm position. Aspiration was confirmed to be negative for CSF and/or blood. A 1-2 ml. volume of Isovue-250 was injected and flow of contrast was noted at each level. Radiographs were obtained for documentation purposes.   After attaining the desired flow of contrast documented above, a 0.5 to 1.0 ml test dose of 0.25% Marcaine was injected into each respective transforaminal space.  The patient was observed for 90 seconds post injection.  After no sensory deficits were reported,  and normal lower extremity motor function was noted,   the above injectate was administered so that equal amounts of the injectate were placed at each foramen (level) into the transforaminal epidural space.   Additional Comments:  The patient tolerated the procedure well Dressing: 2 x 2 sterile gauze and Band-Aid    Post-procedure details: Patient was observed during the procedure. Post-procedure instructions were reviewed.  Patient left  the clinic in stable condition.     Clinical History: NCV & EMG Findings: 11/10/2019 Extensive electrodiagnostic testing of the right lower extremity and additional studies of the left shows:  1. Bilateral sural and superficial peroneal sensory responses are within normal limits. 2. Bilateral peroneal and right tibial motor responses are within normal limits.  Left tibial motor response shows reduced amplitude (2.5 mV). 3. Bilateral tibial H reflex studies are within normal limits. 4. 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  Impression: 1. 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 2. There is no evidence of a large fiber sensorimotor neuropathy affecting the lower extremities.    ___________________________ Nita Sickle, DO  --------- MRI LUMBAR SPINE WITHOUT CONTRAST    TECHNIQUE:  Multiplanar, multisequence MR imaging of the lumbar spine was  performed. No intravenous contrast was administered.    COMPARISON: None.    FINDINGS:  Segmentation: Standard.    Alignment: Straightening of the lumbar lordosis. Anteroposterior  alignment is maintained.    Vertebrae: Vertebral body heights are maintained apart from  degenerative endplate irregularity at L4 and L5. There is no  significant marrow edema. No suspicious osseous lesion.    Conus medullaris and cauda equina: Conus extends to the L1-L2 level.  Conus and cauda equina appear normal.    Paraspinal and other soft tissues: Unremarkable.    Disc levels:    L1-L2: No canal or foraminal stenosis.    L2-L3: No canal or foraminal stenosis.    L3-L4: No canal or foraminal stenosis.    L4-L5: Disc desiccation and mild disc height loss. Disc bulge and  mild facet arthropathy. No canal stenosis. Mild to moderate right  and mild left foraminal stenosis.    L5-S1: Disc desiccation and moderate disc height  loss. Disc bulge  with endplate osteophytic ridging. Prominent circumferential  epidural fat partially effacing the thecal sac. No significant right  foraminal stenosis. Mild to moderate left foraminal stenosis.    IMPRESSION:  Lower lumbar degenerative changes as detailed above.      Electronically Signed  By: Guadlupe Spanish M.D.  On: 11/04/2019 13:05     Objective:  VS:  HT:    WT:   BMI:     BP:139/81  HR:80bpm  TEMP: ( )  RESP:  Physical Exam  Ortho Exam Imaging: XR C-ARM NO REPORT  Result Date: 03/07/2020 Please see Notes tab for imaging impression.

## 2020-03-07 NOTE — Progress Notes (Signed)
 .  Numeric Pain Rating Scale and Functional Assessment Average Pain 8   In the last MONTH (on 0-10 scale) has pain interfered with the following?  1. General activity like being  able to carry out your everyday physical activities such as walking, climbing stairs, carrying groceries, or moving a chair?  Rating(8)   +Driver, -BT, -Dye Allergies.  

## 2020-03-07 NOTE — Procedures (Signed)
Lumbosacral Transforaminal Epidural Steroid Injection - Sub-Pedicular Approach with Fluoroscopic Guidance  Patient: Tony [name]      Date of Birth: [birth year] MRN: [id number] PCP: System, Pcp Not In      Visit Date: 03/07/2020   Universal Protocol:    Date/Time: 03/07/2020  Consent Given By: the patient  Position: PRONE  Additional Comments: Vital signs were monitored before and after the procedure. Patient was prepped and draped in the usual sterile fashion. The correct patient, procedure, and site was verified.   Injection Procedure Details:  Procedure Site One Meds Administered:  Meds ordered this encounter  Medications  . methylPREDNISolone acetate (DEPO-MEDROL) injection 40 mg    Laterality: Bilateral  Location/Site:  S1-2  Needle size: 22 G  Needle type: Spinal  Needle Placement: Transforaminal  Findings:    -Comments: Excellent flow of contrast along the nerve and into the epidural space.  Procedure Details: After squaring off the end-plates to get a true AP view, the C-arm was positioned so that an oblique view of the foramen as noted above was visualized. The target area is just inferior to the "nose of the scotty dog" or sub pedicular. The soft tissues overlying this structure were infiltrated with 2-3 ml. of 1% Lidocaine without Epinephrine.  The spinal needle was inserted toward the target using a "trajectory" view along the fluoroscope beam.  Under AP and lateral visualization, the needle was advanced so it did not puncture dura and was located close the 6 O'Clock position of the pedical in AP tracterory. Biplanar projections were used to confirm position. Aspiration was confirmed to be negative for CSF and/or blood. A 1-2 ml. volume of Isovue-250 was injected and flow of contrast was noted at each level. Radiographs were obtained for documentation purposes.   After attaining the desired flow of contrast documented above, a 0.5 to 1.0 ml test dose of  0.25% Marcaine was injected into each respective transforaminal space.  The patient was observed for 90 seconds post injection.  After no sensory deficits were reported, and normal lower extremity motor function was noted,   the above injectate was administered so that equal amounts of the injectate were placed at each foramen (level) into the transforaminal epidural space.   Additional Comments:  The patient tolerated the procedure well Dressing: 2 x 2 sterile gauze and Band-Aid    Post-procedure details: Patient was observed during the procedure. Post-procedure instructions were reviewed.  Patient left the clinic in stable condition.

## 2020-03-10 ENCOUNTER — Ambulatory Visit: Payer: Medicare Other | Admitting: Family Medicine

## 2020-03-11 ENCOUNTER — Encounter: Payer: Self-pay | Admitting: Family Medicine

## 2020-03-11 ENCOUNTER — Ambulatory Visit: Payer: Self-pay

## 2020-03-11 ENCOUNTER — Ambulatory Visit (INDEPENDENT_AMBULATORY_CARE_PROVIDER_SITE_OTHER): Payer: Medicare Other | Admitting: Family Medicine

## 2020-03-11 ENCOUNTER — Other Ambulatory Visit: Payer: Self-pay

## 2020-03-11 DIAGNOSIS — S63602D Unspecified sprain of left thumb, subsequent encounter: Secondary | ICD-10-CM

## 2020-03-11 NOTE — Progress Notes (Signed)
Wants injection in left hand with ultrasound today per Dr. Alvester Morin. Has completed physical therapy with the hand.
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
## 2020-04-14 ENCOUNTER — Encounter: Payer: Self-pay | Admitting: Family Medicine

## 2020-04-14 ENCOUNTER — Ambulatory Visit (INDEPENDENT_AMBULATORY_CARE_PROVIDER_SITE_OTHER): Payer: Medicare Other | Admitting: Family Medicine

## 2020-04-14 ENCOUNTER — Other Ambulatory Visit: Payer: Self-pay

## 2020-04-14 DIAGNOSIS — M546 Pain in thoracic spine: Secondary | ICD-10-CM | POA: Diagnosis not present

## 2020-04-14 MED ORDER — HYDROCODONE-ACETAMINOPHEN 5-325 MG PO TABS: 1.0000 | ORAL_TABLET | ORAL | 0 refills | Status: DC | PRN

## 2020-04-14 NOTE — Addendum Note (Signed)
Addended by: Lillia Carmel on: 04/14/2020 09:26 AM   Modules accepted: Orders

## 2020-04-14 NOTE — Progress Notes (Signed)
   Office Visit Note   Patient: Tony [name]           Date of Birth: [birth year]           MRN: [id number] Visit Date: 04/14/2020 Requested by: No referring provider defined for this encounter. PCP: System, Pcp Not In  Subjective: Chief Complaint  Patient presents with  . Middle Back - Pain    Knot between the shoulder blades has returned 5 days ago. Not as big as before - that got better with trigger point injection.    HPI: He is here with left thoracic back pain.  We injected a trigger point about 2 months ago with good results.  It started to return 5 days ago, not quite as bad as before.  Overall he is doing very well.  He is without a walker and is feeling good, he is working with his therapist to try to improve his PTSD symptoms so that he can resume driving again.  Once he is able, he plans to return to [place].              ROS:   All other systems were reviewed and are negative.  Objective: Vital Signs: There were no vitals taken for this visit.  Physical Exam:  General:  Alert and oriented, in no acute distress. Pulm:  Breathing unlabored. Psy:  Normal mood, congruent affect.  Thoracic spine: He has no bony tenderness, but he does have another tender trigger point around the T6-8 level.  Imaging: No results found.  Assessment & Plan: 1.  Myofascial left thoracic back pain -He would like another trigger point injection today.  He will follow-up as needed for this.     Procedures: Trigger point injection: After sterile prep with Betadine, injected 3 cc 1% lidocaine without epinephrine and 40 mg methylprednisolone into the symptomatic trigger point.   PMFS History: There are no problems to display for this patient.  Past Medical History:  Diagnosis Date  . Back pain   . Diabetes mellitus without complication (HCC)   . Hypertension     History reviewed. No pertinent family history.  History reviewed. No pertinent surgical history. Social History    Occupational History  . Not on file  Tobacco Use  . Smoking status: Never Smoker  . Smokeless tobacco: Never Used  Substance and Sexual Activity  . Alcohol use: Not on file  . Drug use: Not on file  . Sexual activity: Not on file

## 2020-04-26 ENCOUNTER — Encounter: Payer: Medicare Other | Attending: Psychology | Admitting: Psychology

## 2020-04-26 ENCOUNTER — Encounter: Payer: Self-pay | Admitting: Psychology

## 2020-04-26 ENCOUNTER — Other Ambulatory Visit: Payer: Self-pay

## 2020-04-26 DIAGNOSIS — E119 Type 2 diabetes mellitus without complications: Secondary | ICD-10-CM | POA: Insufficient documentation

## 2020-04-26 DIAGNOSIS — F4310 Post-traumatic stress disorder, unspecified: Secondary | ICD-10-CM

## 2020-04-26 DIAGNOSIS — M5416 Radiculopathy, lumbar region: Secondary | ICD-10-CM | POA: Diagnosis not present

## 2020-04-26 DIAGNOSIS — I10 Essential (primary) hypertension: Secondary | ICD-10-CM | POA: Insufficient documentation
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
## 2020-06-07 ENCOUNTER — Encounter: Payer: Medicare Other | Attending: Psychology | Admitting: Psychology

## 2020-06-07 ENCOUNTER — Other Ambulatory Visit: Payer: Self-pay

## 2020-06-07 DIAGNOSIS — I10 Essential (primary) hypertension: Secondary | ICD-10-CM | POA: Diagnosis not present

## 2020-06-07 DIAGNOSIS — M5416 Radiculopathy, lumbar region: Secondary | ICD-10-CM | POA: Diagnosis not present

## 2020-06-07 DIAGNOSIS — E119 Type 2 diabetes mellitus without complications: Secondary | ICD-10-CM | POA: Insufficient documentation

## 2020-06-07 DIAGNOSIS — F4310 Post-traumatic stress disorder, unspecified: Secondary | ICD-10-CM | POA: Diagnosis not present

## 2020-06-08 ENCOUNTER — Encounter: Payer: Self-pay | Admitting: Psychology
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
## 2020-06-16 ENCOUNTER — Other Ambulatory Visit: Payer: Self-pay

## 2020-06-16 ENCOUNTER — Encounter: Payer: Medicare Other | Attending: Psychology | Admitting: Psychology

## 2020-06-16 DIAGNOSIS — M5416 Radiculopathy, lumbar region: Secondary | ICD-10-CM | POA: Diagnosis not present

## 2020-06-16 DIAGNOSIS — E119 Type 2 diabetes mellitus without complications: Secondary | ICD-10-CM | POA: Diagnosis not present

## 2020-06-16 DIAGNOSIS — F4310 Post-traumatic stress disorder, unspecified: Secondary | ICD-10-CM | POA: Insufficient documentation

## 2020-06-16 DIAGNOSIS — I10 Essential (primary) hypertension: Secondary | ICD-10-CM | POA: Insufficient documentation

## 2020-06-23 ENCOUNTER — Encounter: Payer: Self-pay | Admitting: Psychology
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
## 2020-06-30 ENCOUNTER — Other Ambulatory Visit: Payer: Self-pay

## 2020-06-30 ENCOUNTER — Encounter (HOSPITAL_BASED_OUTPATIENT_CLINIC_OR_DEPARTMENT_OTHER): Payer: Medicare Other | Admitting: Psychology

## 2020-06-30 DIAGNOSIS — M5416 Radiculopathy, lumbar region: Secondary | ICD-10-CM

## 2020-06-30 DIAGNOSIS — F4310 Post-traumatic stress disorder, unspecified: Secondary | ICD-10-CM

## 2020-07-06 ENCOUNTER — Other Ambulatory Visit: Payer: Self-pay

## 2020-07-06 ENCOUNTER — Other Ambulatory Visit: Payer: Self-pay | Admitting: Family Medicine

## 2020-07-06 ENCOUNTER — Ambulatory Visit (INDEPENDENT_AMBULATORY_CARE_PROVIDER_SITE_OTHER): Payer: Medicare Other | Admitting: Family Medicine

## 2020-07-06 ENCOUNTER — Encounter: Payer: Self-pay | Admitting: Family Medicine

## 2020-07-06 DIAGNOSIS — M545 Low back pain, unspecified: Secondary | ICD-10-CM

## 2020-07-06 MED ORDER — HYDROCODONE-ACETAMINOPHEN 5-325 MG PO TABS: 1.0000 | ORAL_TABLET | ORAL | 0 refills | Status: DC | PRN
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
## 2020-07-06 NOTE — Addendum Note (Signed)
Addended by: Lillia Carmel on: 07/06/2020 09:15 AM   Modules accepted: Orders

## 2020-07-06 NOTE — Addendum Note (Signed)
Addended by: Lillia Carmel on: 07/06/2020 03:02 PM   Modules accepted: Orders

## 2020-07-25 ENCOUNTER — Encounter: Payer: Self-pay | Admitting: Family Medicine

## 2020-07-25 ENCOUNTER — Other Ambulatory Visit: Payer: Self-pay

## 2020-07-25 ENCOUNTER — Ambulatory Visit (INDEPENDENT_AMBULATORY_CARE_PROVIDER_SITE_OTHER): Payer: Medicare Other | Admitting: Family Medicine

## 2020-07-25 DIAGNOSIS — M545 Low back pain, unspecified: Secondary | ICD-10-CM
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
## 2020-07-27 ENCOUNTER — Other Ambulatory Visit: Payer: Self-pay

## 2020-07-27 ENCOUNTER — Encounter: Payer: Medicare Other | Attending: Psychology | Admitting: Psychology

## 2020-07-27 ENCOUNTER — Encounter: Payer: Self-pay | Admitting: Psychology

## 2020-07-27 DIAGNOSIS — M5416 Radiculopathy, lumbar region: Secondary | ICD-10-CM

## 2020-07-27 DIAGNOSIS — F4310 Post-traumatic stress disorder, unspecified: Secondary | ICD-10-CM | POA: Insufficient documentation

## 2020-07-27 DIAGNOSIS — E119 Type 2 diabetes mellitus without complications: Secondary | ICD-10-CM | POA: Insufficient documentation

## 2020-07-27 DIAGNOSIS — I10 Essential (primary) hypertension: Secondary | ICD-10-CM | POA: Diagnosis not present
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
## 2020-07-29 ENCOUNTER — Telehealth: Payer: Self-pay | Admitting: Family Medicine

## 2020-07-29 MED ORDER — HYDROCODONE-ACETAMINOPHEN 5-325 MG PO TABS: 1.0000 | ORAL_TABLET | ORAL | 0 refills | Status: DC | PRN

## 2020-07-29 NOTE — Telephone Encounter (Signed)
I called and advised the patient. 

## 2020-07-29 NOTE — Telephone Encounter (Signed)
Patient called asking for refill of hydrocodone. Please send to pharmacy. Please call him at [phone].

## 2020-07-29 NOTE — Telephone Encounter (Signed)
Sent!

## 2020-08-08 ENCOUNTER — Encounter: Payer: Self-pay | Admitting: Psychology
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
## 2020-09-20 ENCOUNTER — Ambulatory Visit (INDEPENDENT_AMBULATORY_CARE_PROVIDER_SITE_OTHER): Payer: Medicare Other | Admitting: Family Medicine

## 2020-09-20 ENCOUNTER — Other Ambulatory Visit: Payer: Self-pay

## 2020-09-20 ENCOUNTER — Encounter: Payer: Self-pay | Admitting: Family Medicine

## 2020-09-20 DIAGNOSIS — M545 Low back pain, unspecified: Secondary | ICD-10-CM

## 2020-09-20 MED ORDER — HYDROCODONE-ACETAMINOPHEN 5-325 MG PO TABS: 1.0000 | ORAL_TABLET | ORAL | 0 refills | Status: DC | PRN

## 2020-09-20 NOTE — Progress Notes (Signed)
   Office Visit Note   Patient: Tony [name]           Date of Birth: December 24, 1950           MRN: [id number] Visit Date: 09/20/2020 Requested by: No referring provider defined for this encounter. PCP: Pcp, No  Subjective: Chief Complaint  Patient presents with  . Lower Back - Pain, Follow-up    Requesting more trigger point injections, bilaterally, 2 each side. Went up to [place] to go to a football game - pain was too bad - he had to give the game tickets away.     HPI: He is here with recurrent low back pain.  Trigger point injections 2 months ago helped, but when he got up to [place] and try to go to a football game, he had a flareup of pain and had to leave the game.  He is requesting injections on both sides of his lower back.              ROS:   All other systems were reviewed and are negative.  Objective: Vital Signs: There were no vitals taken for this visit.  Physical Exam:  General:  Alert and oriented, in no acute distress. Pulm:  Breathing unlabored. Psy:  Normal mood, congruent affect. Skin: No bruising or erythema Low back: He has 2 tender trigger points on each side of his lumbar area, lateral to the SI joints.  Imaging: No results found.  Assessment & Plan: 1.  Recurrent myofascial low back pain -Each of the 4 trigger points was injected with 2-1/2 cc of a solution containing 6 cc 1% lidocaine without epinephrine and 4 cc of 50% dextrose.  Refilled his pain medication.  We will see him back as needed.       PMFS History: There are no problems to display for this patient.  Past Medical History:  Diagnosis Date  . Back pain   . Diabetes mellitus without complication (HCC)   . Hypertension     History reviewed. No pertinent family history.  History reviewed. No pertinent surgical history. Social History   Occupational History  . Not on file  Tobacco Use  . Smoking status: Never Smoker  . Smokeless tobacco: Never Used  Substance and Sexual  Activity  . Alcohol use: Not on file  . Drug use: Not on file  . Sexual activity: Not on file

## 2020-09-29 ENCOUNTER — Other Ambulatory Visit: Payer: Self-pay

## 2020-09-29 ENCOUNTER — Encounter: Payer: Medicare Other | Attending: Psychology | Admitting: Psychology

## 2020-09-29 DIAGNOSIS — F4310 Post-traumatic stress disorder, unspecified: Secondary | ICD-10-CM | POA: Insufficient documentation

## 2020-09-29 DIAGNOSIS — F410 Panic disorder [episodic paroxysmal anxiety] without agoraphobia: Secondary | ICD-10-CM | POA: Diagnosis present

## 2020-09-29 DIAGNOSIS — M5416 Radiculopathy, lumbar region: Secondary | ICD-10-CM | POA: Insufficient documentation

## 2020-09-30 ENCOUNTER — Ambulatory Visit: Payer: Medicare Other | Admitting: Family Medicine

## 2020-10-04 ENCOUNTER — Telehealth: Payer: Self-pay | Admitting: Family Medicine

## 2020-10-04 DIAGNOSIS — F4310 Post-traumatic stress disorder, unspecified: Secondary | ICD-10-CM

## 2020-10-04 NOTE — Telephone Encounter (Signed)
Pt would like a referral for Dr. Gilmore Laroche in [place] for psychiatry   Dr.Akhtar Fax# [phone]

## 2020-10-04 NOTE — Telephone Encounter (Signed)
Orders placed.

## 2020-10-04 NOTE — Telephone Encounter (Signed)
I called and advised the patient. 

## 2020-10-17 ENCOUNTER — Encounter: Payer: Self-pay | Admitting: Psychology
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
## 2020-10-20 ENCOUNTER — Telehealth (INDEPENDENT_AMBULATORY_CARE_PROVIDER_SITE_OTHER): Payer: Medicare Other | Admitting: Psychiatry

## 2020-10-20 ENCOUNTER — Encounter (HOSPITAL_COMMUNITY): Payer: Self-pay | Admitting: Psychiatry

## 2020-10-20 DIAGNOSIS — F410 Panic disorder [episodic paroxysmal anxiety] without agoraphobia: Secondary | ICD-10-CM

## 2020-10-20 DIAGNOSIS — F4310 Post-traumatic stress disorder, unspecified: Secondary | ICD-10-CM | POA: Diagnosis not present

## 2020-10-20 MED ORDER — ESCITALOPRAM OXALATE 10 MG PO TABS: 10.0000 mg | ORAL_TABLET | Freq: Every day | ORAL | 0 refills | Status: DC
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
## 2020-11-14 ENCOUNTER — Telehealth (HOSPITAL_COMMUNITY): Payer: Medicare Other | Admitting: Psychiatry

## 2020-11-15 ENCOUNTER — Telehealth (INDEPENDENT_AMBULATORY_CARE_PROVIDER_SITE_OTHER): Payer: Medicare Other | Admitting: Psychiatry

## 2020-11-15 ENCOUNTER — Encounter (HOSPITAL_COMMUNITY): Payer: Self-pay | Admitting: Psychiatry

## 2020-11-15 DIAGNOSIS — F4310 Post-traumatic stress disorder, unspecified: Secondary | ICD-10-CM

## 2020-11-15 DIAGNOSIS — F410 Panic disorder [episodic paroxysmal anxiety] without agoraphobia: Secondary | ICD-10-CM | POA: Diagnosis not present

## 2020-11-15 MED ORDER — ESCITALOPRAM OXALATE 10 MG PO TABS: 10.0000 mg | ORAL_TABLET | Freq: Every day | ORAL | 0 refills | Status: DC
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
## 2020-11-21 ENCOUNTER — Other Ambulatory Visit (HOSPITAL_COMMUNITY): Payer: Self-pay

## 2020-11-21 MED ORDER — ESCITALOPRAM OXALATE 10 MG PO TABS: 10.0000 mg | ORAL_TABLET | Freq: Every day | ORAL | 0 refills | Status: DC

## 2020-12-19 ENCOUNTER — Other Ambulatory Visit: Payer: Self-pay

## 2020-12-19 ENCOUNTER — Encounter: Payer: Medicare Other | Attending: Psychology | Admitting: Psychology

## 2020-12-19 DIAGNOSIS — F410 Panic disorder [episodic paroxysmal anxiety] without agoraphobia: Secondary | ICD-10-CM | POA: Diagnosis not present

## 2020-12-19 DIAGNOSIS — M5116 Intervertebral disc disorders with radiculopathy, lumbar region: Secondary | ICD-10-CM | POA: Diagnosis not present

## 2020-12-19 DIAGNOSIS — F4310 Post-traumatic stress disorder, unspecified: Secondary | ICD-10-CM | POA: Diagnosis not present

## 2020-12-19 DIAGNOSIS — M5416 Radiculopathy, lumbar region: Secondary | ICD-10-CM | POA: Diagnosis not present

## 2020-12-20 ENCOUNTER — Encounter: Payer: Self-pay | Admitting: Psychology
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
## 2020-12-29 ENCOUNTER — Ambulatory Visit: Payer: Medicare Other | Admitting: Psychology

## 2020-12-29 ENCOUNTER — Other Ambulatory Visit: Payer: Self-pay | Admitting: Family Medicine

## 2020-12-29 ENCOUNTER — Other Ambulatory Visit: Payer: Self-pay

## 2020-12-29 ENCOUNTER — Encounter: Payer: Self-pay | Admitting: Family Medicine

## 2020-12-29 ENCOUNTER — Telehealth: Payer: Self-pay | Admitting: Family Medicine

## 2020-12-29 ENCOUNTER — Ambulatory Visit
Admission: RE | Admit: 2020-12-29 | Discharge: 2020-12-29 | Disposition: A | Discharge status: Home / Self Care | Payer: Medicare Other | Source: Ambulatory Visit | Attending: Family Medicine | Admitting: Family Medicine

## 2020-12-29 ENCOUNTER — Ambulatory Visit (INDEPENDENT_AMBULATORY_CARE_PROVIDER_SITE_OTHER): Payer: Medicare Other | Admitting: Family Medicine

## 2020-12-29 DIAGNOSIS — M542 Cervicalgia: Secondary | ICD-10-CM

## 2020-12-29 DIAGNOSIS — R0602 Shortness of breath: Secondary | ICD-10-CM | POA: Diagnosis not present

## 2020-12-29 DIAGNOSIS — M79642 Pain in left hand: Secondary | ICD-10-CM

## 2020-12-29 DIAGNOSIS — M545 Low back pain, unspecified: Secondary | ICD-10-CM

## 2020-12-29 IMAGING — CR DG CERVICAL SPINE 2 OR 3 VIEWS
5 series · 5 of 5 positions shown · non-contrast
Comparison: CT [DATE].

CLINICAL DATA: Neck pain.

EXAM:
CERVICAL SPINE - 2-3 VIEW

[w cervical spine lat]
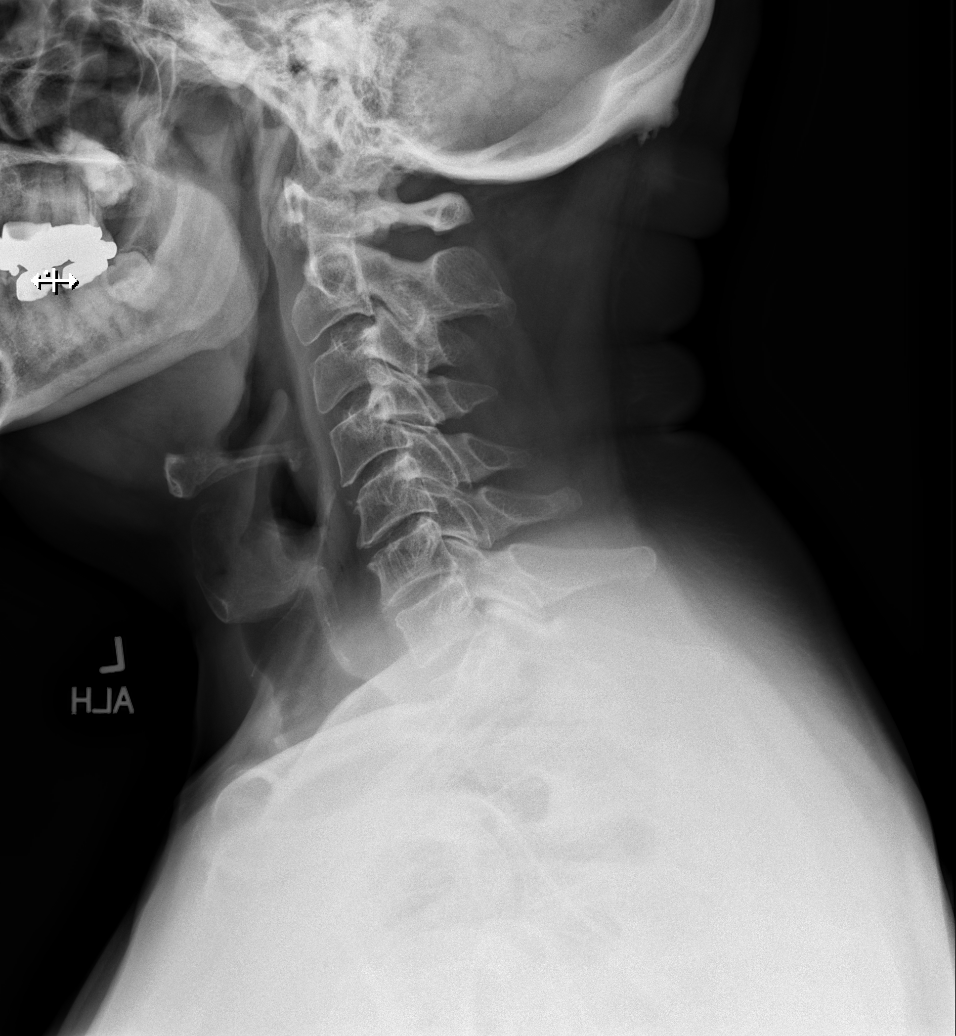

[w cervical spine ap]
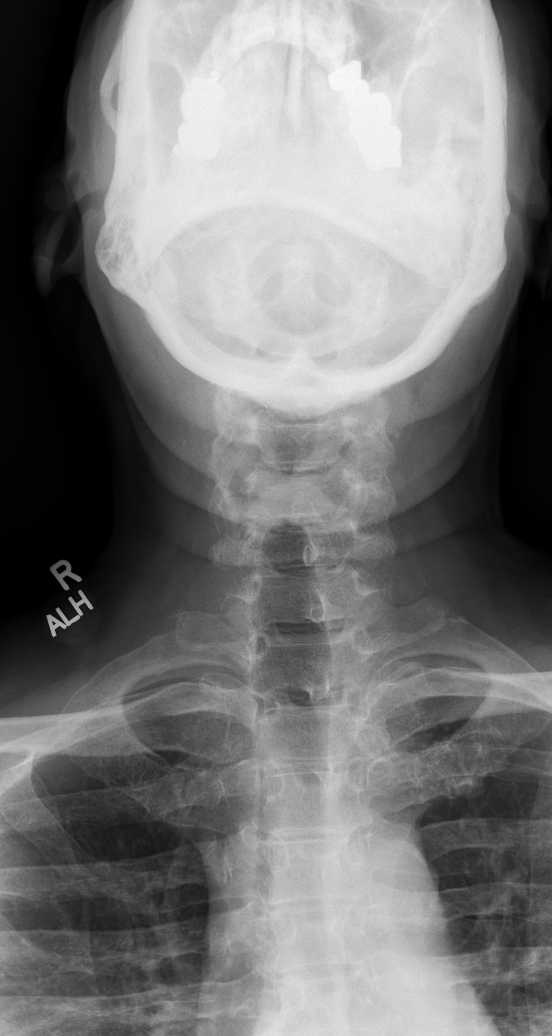

[w cervical spine odontoid (1 of 2)]
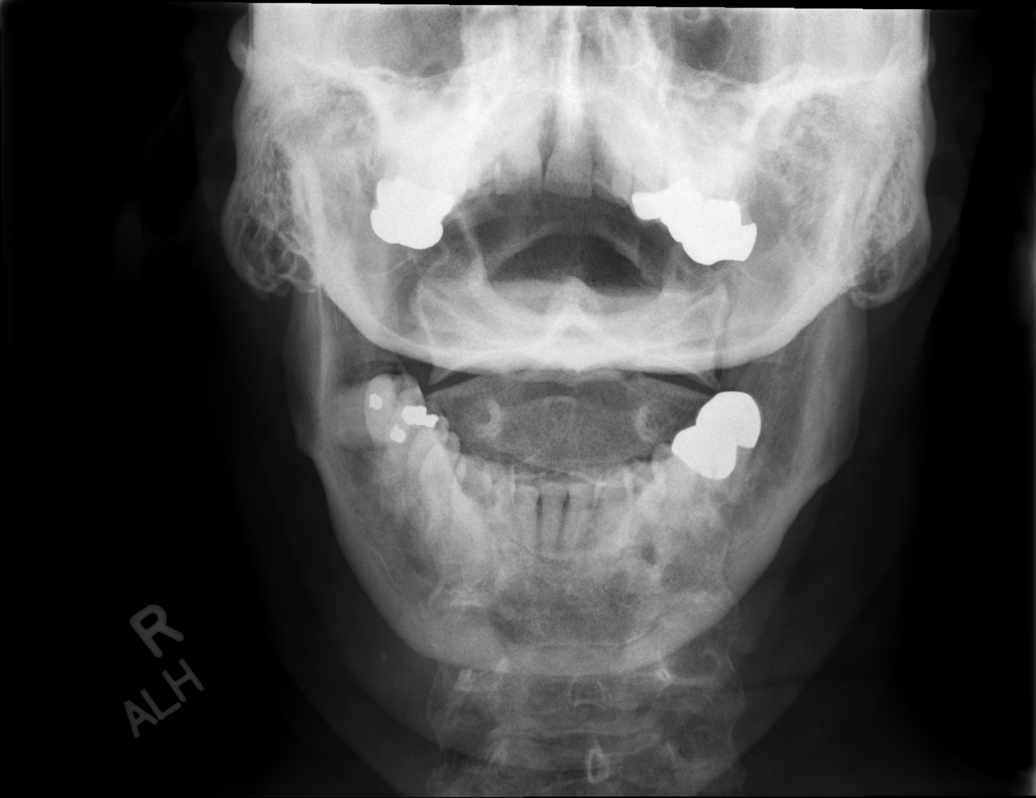

[w cervical spine odontoid (2 of 2)]
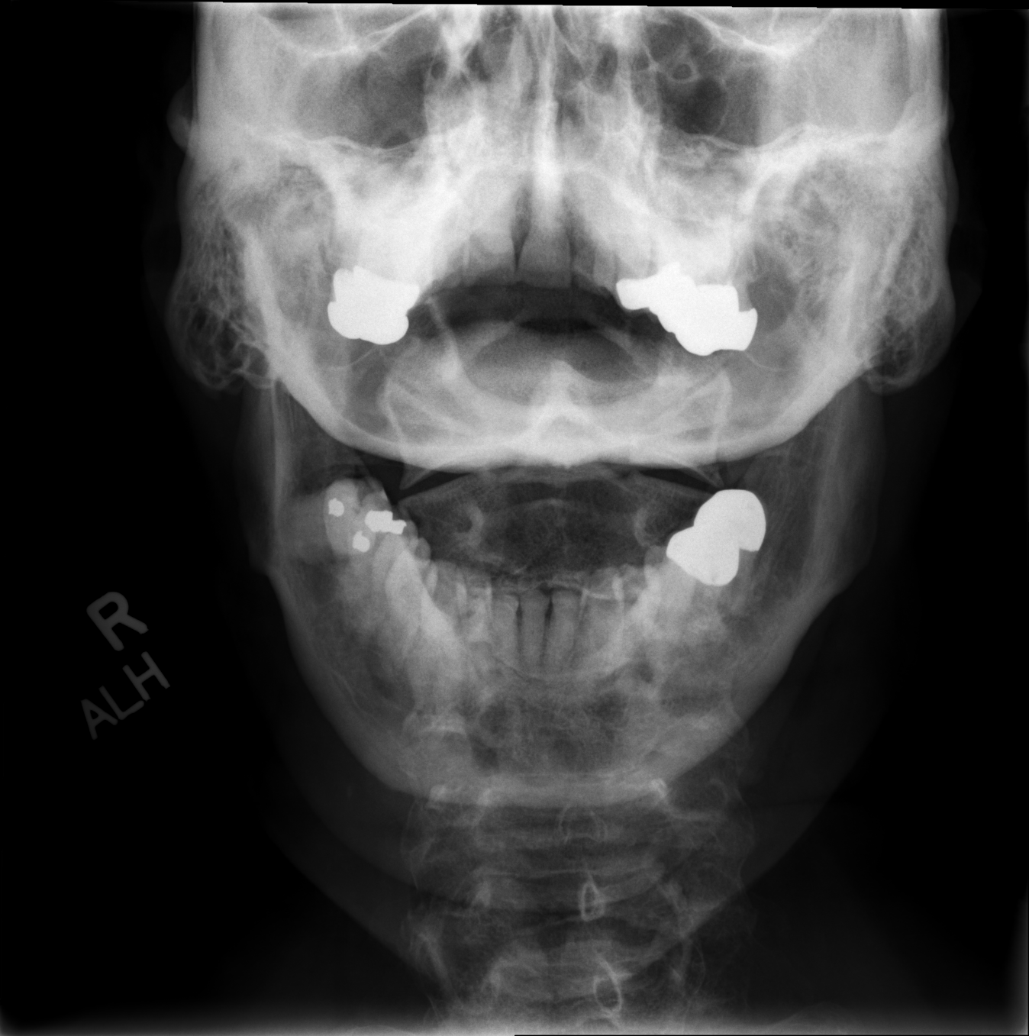

[w cervical swimmers]
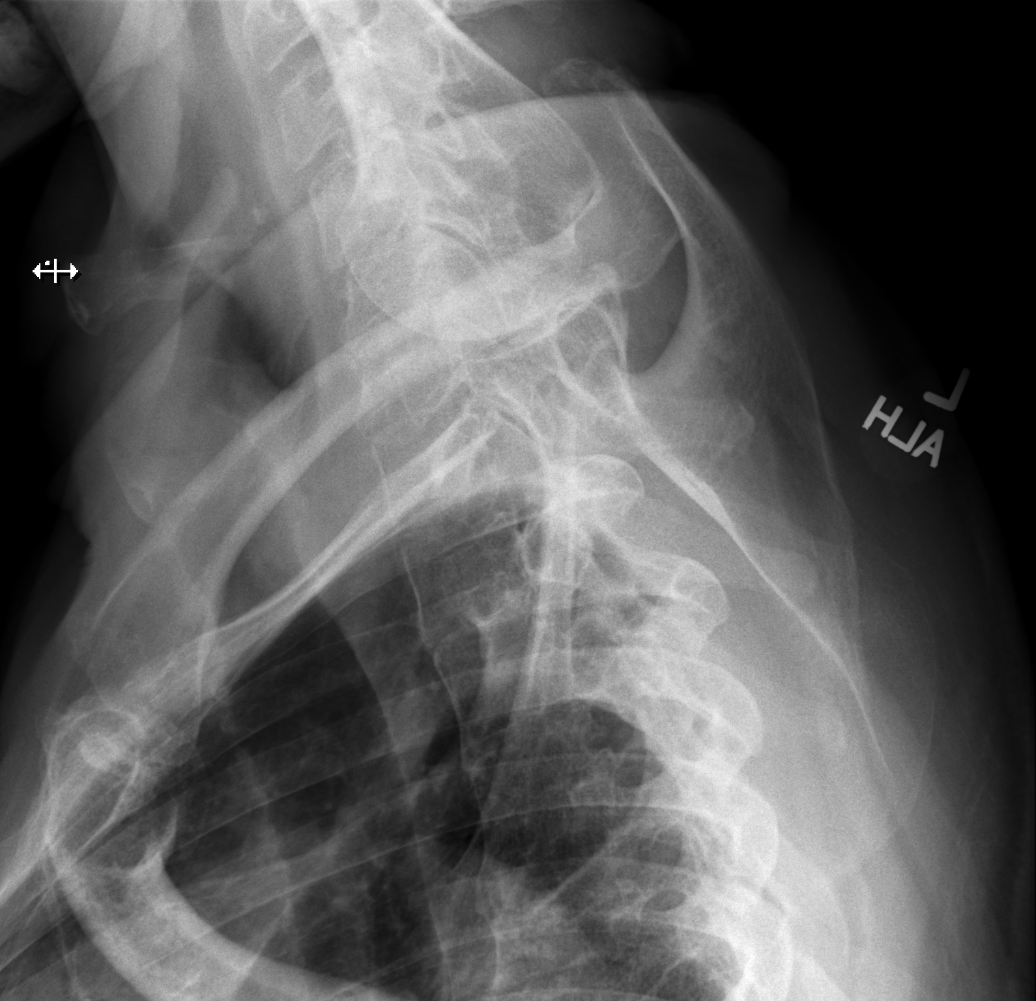

[5 of 5 positions shown; findings below may reference images not displayed]

FINDINGS: Diffuse osteopenia. Degenerative changes C4-C5, C5-C6, and C6-C7
with disc degeneration and mild endplate osteophyte formation. No
acute bony abnormality. No evidence of fracture. Carotid vascular
calcification cannot be excluded. Pulmonary apices are clear.
IMPRESSION: 1. Diffuse osteopenia. Degenerative changes C4-C5, C5-C6, and C6-C7.
No acute bony abnormality.
2. Carotid vascular disease cannot be excluded.

## 2020-12-29 IMAGING — CR DG CHEST 2V
2 series · 2 of 2 positions shown · non-contrast
Comparison: [DATE]

CLINICAL DATA: 69-year-old male with shortness of breath at night.

EXAM:
CHEST - 2 VIEW

[w chest pa]
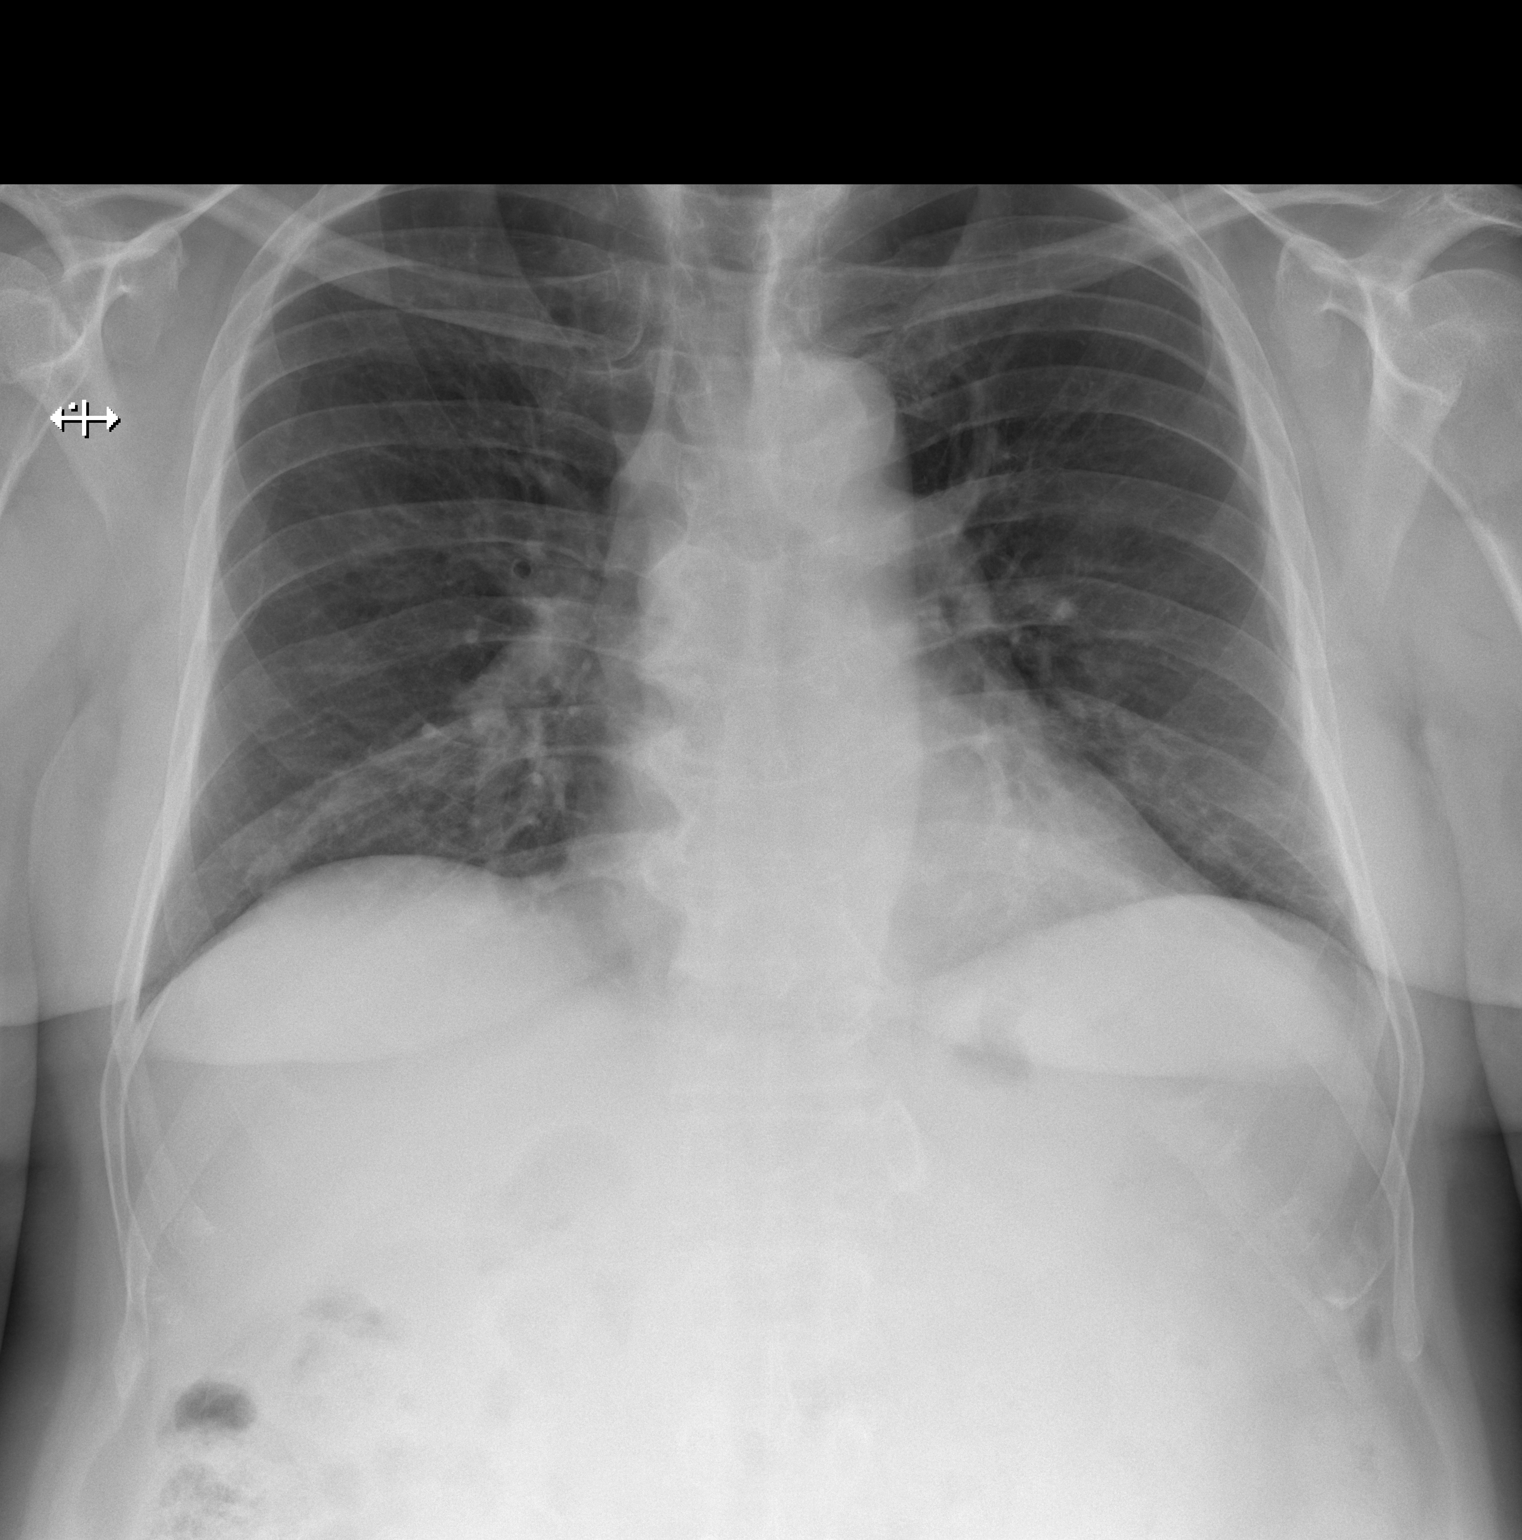

[w chest lat]
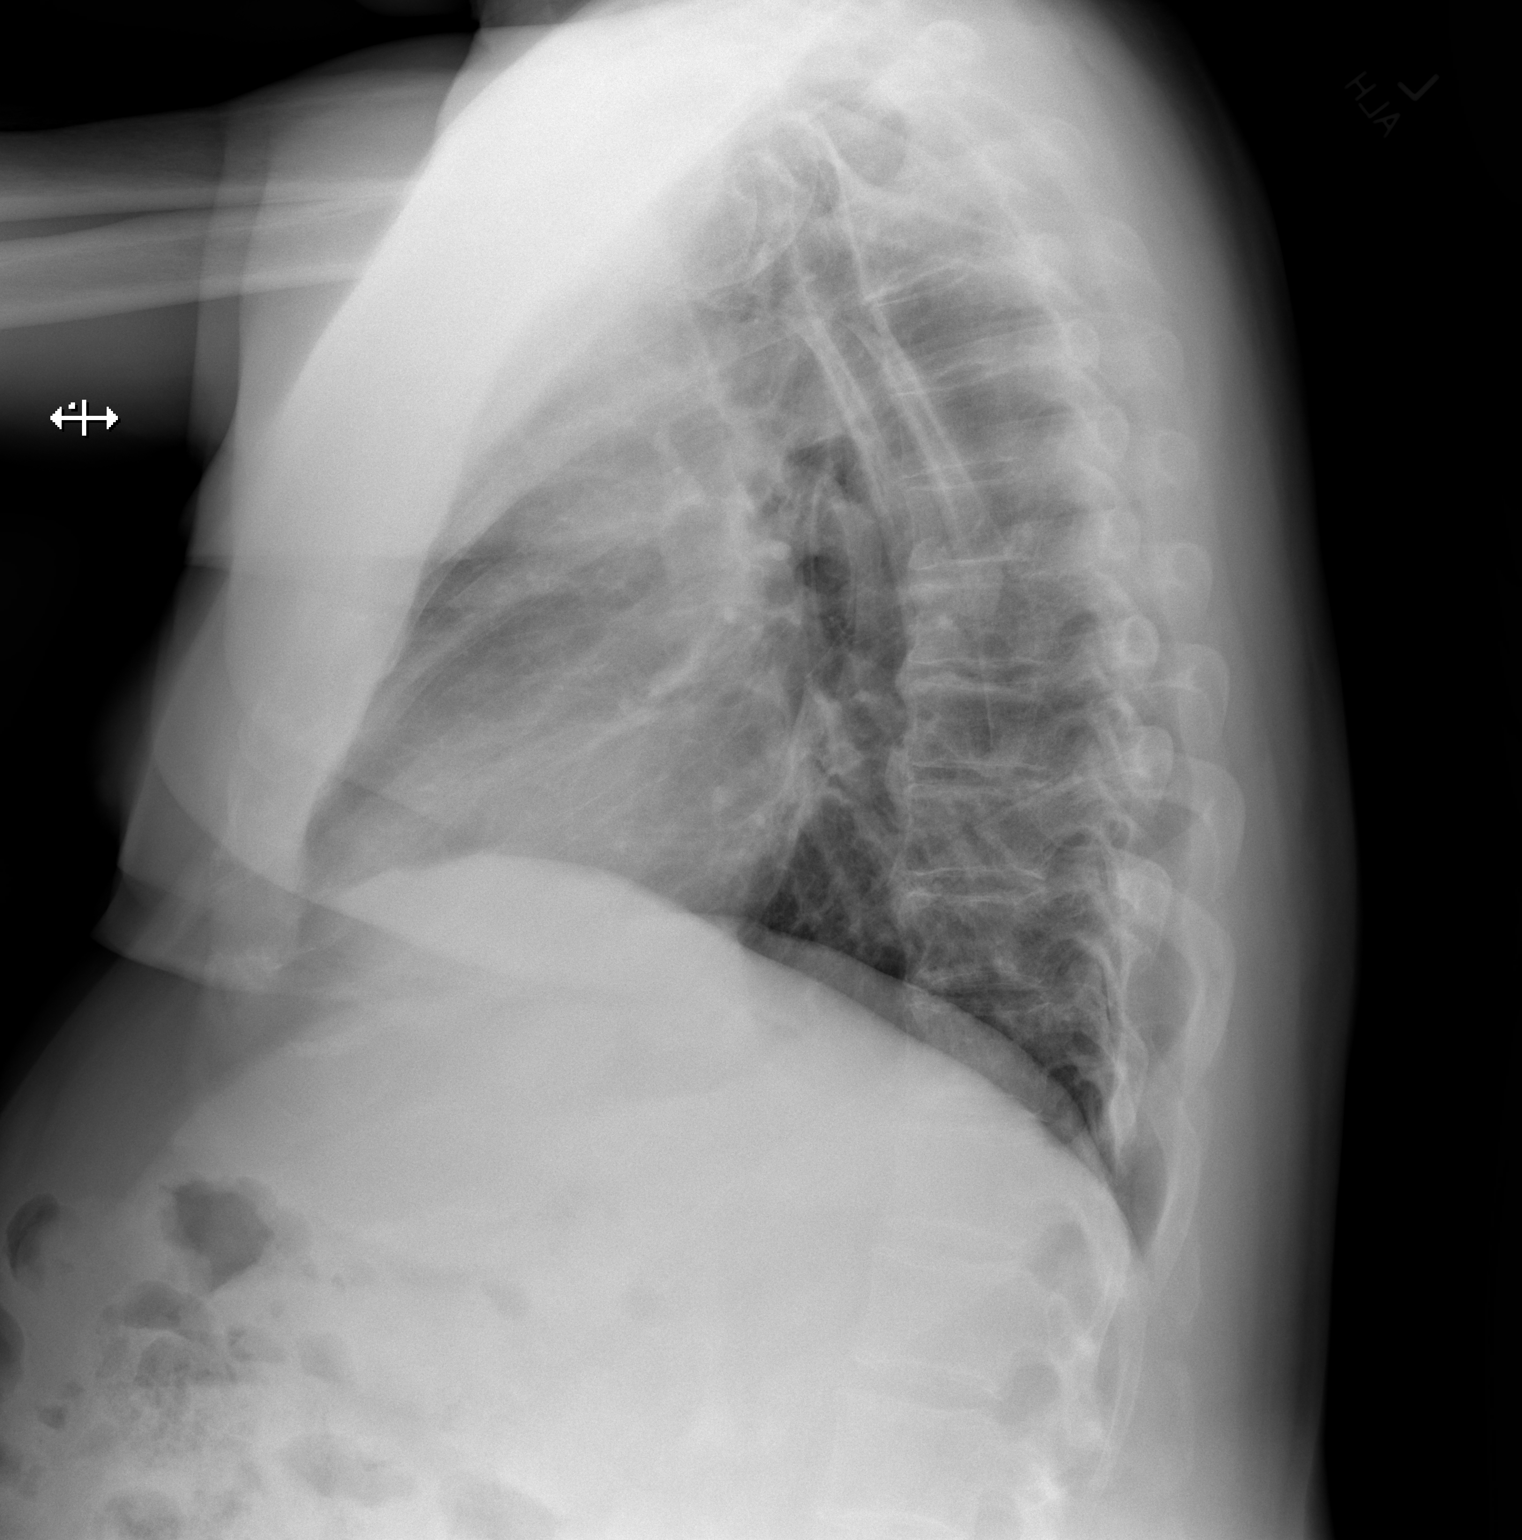

[2 of 2 positions shown; findings below may reference images not displayed]

FINDINGS: The heart size and mediastinal contours are within normal limits.
Both lungs are clear. The visualized skeletal structures are
unremarkable.
IMPRESSION: No acute cardiopulmonary process.

## 2020-12-29 MED ORDER — LIDOCAINE 5 % EX PTCH: 1.0000 | MEDICATED_PATCH | CUTANEOUS | 11 refills | Status: DC

## 2020-12-29 MED ORDER — HYDROCODONE-ACETAMINOPHEN 5-325 MG PO TABS: 1.0000 | ORAL_TABLET | ORAL | 0 refills | Status: DC | PRN

## 2020-12-29 NOTE — Telephone Encounter (Signed)
X-rays of the cervical spine reveal mild to moderate arthritis in the facet joints on the back of the spine at the last 3 levels.

## 2020-12-29 NOTE — Progress Notes (Signed)
° °  Office Visit Note   Patient: Tony [name]           Date of Birth: 28-Sep-1951           MRN: [id number] Visit Date: 12/29/2020 Requested by: No referring provider defined for this encounter. PCP: Pcp, No  Subjective: Chief Complaint  Patient presents with   Lower Back - Pain    Requesting trigger point injections. Having some issues with taking deep breaths - chiropractor said he should mention that today.   Left Hand - Pain    HPI: He is here with recurrent low back pain, left lower thoracic pain, left hand pain.  Lidoderm patches have helped a lot with his back.  But recently it flared up again and he would like trigger point injections.  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  Last injection was in March.  He notes that he has been having some trouble sleeping, when he lies down he finds it difficult to breathe at times.  He is having to use a couple pillows.  He is still struggling with PTSD and is not able to return to [place] yet.                ROS:   All other systems were reviewed and are negative.  Objective: Vital Signs: There were no vitals taken for this visit.  Physical Exam:  General:  Alert and oriented, in no acute distress. Pulm:  Breathing unlabored. Psy:  Normal mood, congruent affect.  Left hand: He is tender on the palm side of his hand near the thumb CMC joint. Low back: He has a tender trigger point in the left lower thoracic paraspinous muscles, and 2 on each side of the lumbar area near the SI joints.   Imaging: No results found.  Assessment & Plan: 1. myofascial low back pain and thoracic pain -Trigger points injected today with dextrose and bupivacaine.  2. left hand CMC arthrosis -Trial of Lidoderm patches.  Could inject again with cortisone if symptoms persist.  3.  Breathing difficulty while sleeping - Chest x-ray ordered.     Procedures: Trigger point injections: After sterile prep with Betadine and  alcohol, injected a total of 12 cc 0.25% bupivacaine and 8 cc 50% dextrose spread apart among 5 trigger points.       PMFS History: There are no problems to display for this patient.  Past Medical History:  Diagnosis Date   Back pain    Diabetes mellitus without complication (HCC)    Hypertension     History reviewed. No pertinent family history.  History reviewed. No pertinent surgical history. Social History   Occupational History   Not on file  Tobacco Use   Smoking status: Never Smoker   Smokeless tobacco: Never Used  Substance and Sexual Activity   Alcohol use: Not on file   Drug use: Not on file   Sexual activity: Not on file

## 2020-12-29 NOTE — Telephone Encounter (Signed)
Chest xray looks normal.

## 2020-12-29 NOTE — Telephone Encounter (Signed)
I called patient and left a voice mail that Dr. Prince Rome added a neck xray order for [facility] to perform.

## 2020-12-29 NOTE — Telephone Encounter (Signed)
Pt is at [facility] because Dr.Hilts wanted an x-ray of his chest but the pt would also like to get one for his neck. [facility] states if we call them and put in the order they can do it today.  [phone]

## 2020-12-29 NOTE — Telephone Encounter (Signed)
I called and left these results on the patient's mobile voice mail.

## 2020-12-29 NOTE — Telephone Encounter (Signed)
Left this info on the patient's voice mail. 

## 2020-12-30 ENCOUNTER — Telehealth: Payer: Self-pay | Admitting: Physical Medicine and Rehabilitation

## 2020-12-30 ENCOUNTER — Telehealth (INDEPENDENT_AMBULATORY_CARE_PROVIDER_SITE_OTHER): Payer: Medicare Other | Admitting: Psychiatry

## 2020-12-30 ENCOUNTER — Encounter (HOSPITAL_COMMUNITY): Payer: Self-pay | Admitting: Psychiatry

## 2020-12-30 DIAGNOSIS — F4310 Post-traumatic stress disorder, unspecified: Secondary | ICD-10-CM

## 2020-12-30 DIAGNOSIS — F410 Panic disorder [episodic paroxysmal anxiety] without agoraphobia: Secondary | ICD-10-CM | POA: Diagnosis not present

## 2020-12-30 DIAGNOSIS — M4302 Spondylolysis, cervical region: Secondary | ICD-10-CM

## 2020-12-30 DIAGNOSIS — M542 Cervicalgia: Secondary | ICD-10-CM

## 2020-12-30 NOTE — Telephone Encounter (Signed)
Patient called requesting a call back to set an appt. Please call patient at [phone].
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
## 2020-12-30 NOTE — Telephone Encounter (Signed)
Per Dr. Prince Rome he should continue with Chiro/Dr. Hollice Espy, if that is not helping then OV is ok, no injections doen in cervical spine without MRI

## 2020-12-30 NOTE — Telephone Encounter (Signed)
Please call patient to set appt. Pt has 2 phone numbers. [phone] or [phone].

## 2020-12-30 NOTE — Telephone Encounter (Signed)
Per Dr. Prince Rome, patient is requesting an appointment for his neck pain.

## 2020-12-30 NOTE — Telephone Encounter (Signed)
Called patient. Line is busy. Unable to leave message.
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
## 2021-01-03 NOTE — Telephone Encounter (Signed)
See previous message

## 2021-01-03 NOTE — Telephone Encounter (Signed)
MRI ordered. Patient aware.

## 2021-01-03 NOTE — Telephone Encounter (Signed)
Certainly, diagnosis cervical spondylosis, neck pain etc, see Dr. Prince Rome notes

## 2021-01-03 NOTE — Telephone Encounter (Signed)
Patient is requesting an order for an MRI. Ok to order?

## 2021-01-05 ENCOUNTER — Encounter: Payer: Self-pay | Admitting: Family Medicine

## 2021-01-05 ENCOUNTER — Ambulatory Visit
Admission: RE | Admit: 2021-01-05 | Discharge: 2021-01-05 | Disposition: A | Discharge status: Home / Self Care | Payer: Medicare Other | Source: Ambulatory Visit | Attending: Physical Medicine and Rehabilitation | Admitting: Physical Medicine and Rehabilitation

## 2021-01-05 ENCOUNTER — Other Ambulatory Visit: Payer: Self-pay

## 2021-01-05 ENCOUNTER — Ambulatory Visit (INDEPENDENT_AMBULATORY_CARE_PROVIDER_SITE_OTHER): Payer: Medicare Other | Admitting: Family Medicine

## 2021-01-05 DIAGNOSIS — M542 Cervicalgia: Secondary | ICD-10-CM

## 2021-01-05 DIAGNOSIS — M4302 Spondylolysis, cervical region: Secondary | ICD-10-CM

## 2021-01-05 IMAGING — MR MR CERVICAL SPINE W/O CM
5 series · 28 of 48 positions shown · non-contrast
Comparison: Plain films [DATE].

CLINICAL DATA: Neck pain, chronic.  Degenerative changes on x-ray.

EXAM:
MRI CERVICAL SPINE WITHOUT CONTRAST
TECHNIQUE: Multiplanar, multisequence MR imaging of the cervical spine was
performed. No intravenous contrast was administered.

[Series 3: T2 · sagittal · 3.0mm · 0.66mm/px · 6 of 12 slices shown (1 of 2)]
[im 1/12]
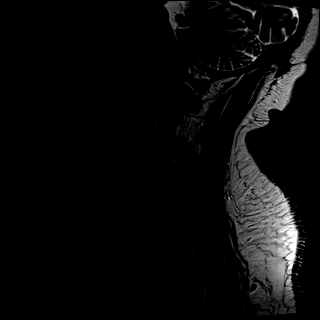
[im 3/12]
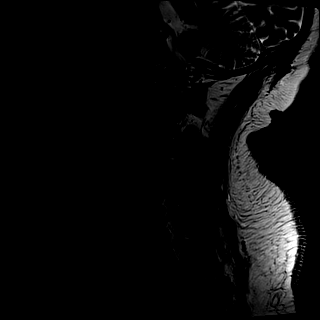
[im 5/12]
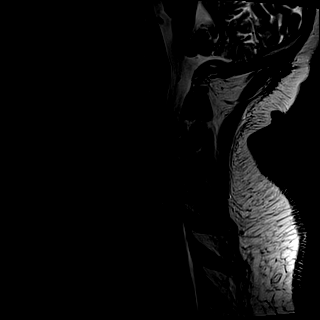
[im 7/12]
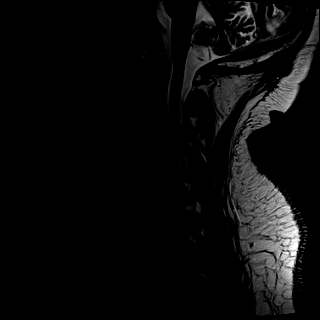
[im 9/12]
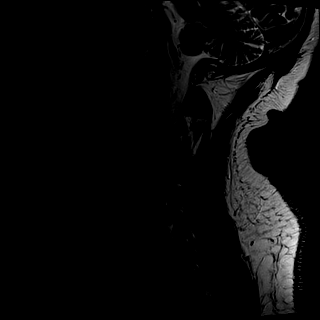
[im 12/12]
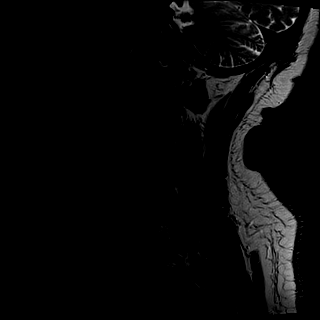

[Series 4: T1 · sagittal · 3.0mm · 0.41mm/px · 6 of 12 slices shown]
[im 1/12]
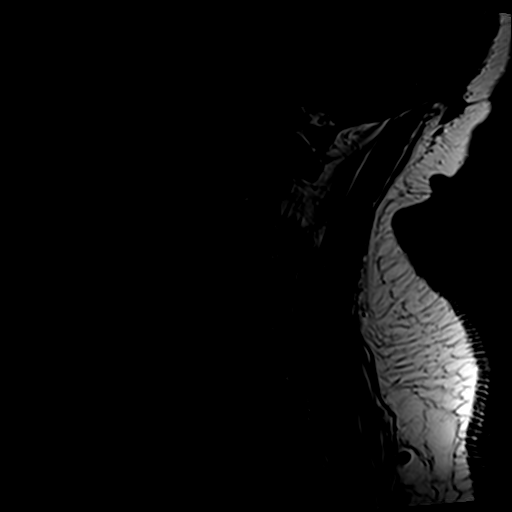
[im 3/12]
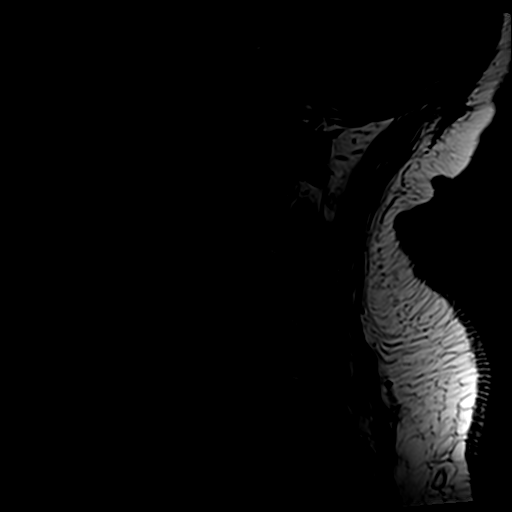
[im 5/12]
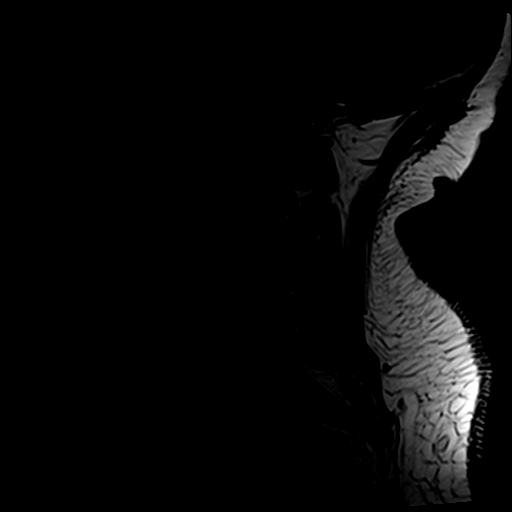
[im 7/12]
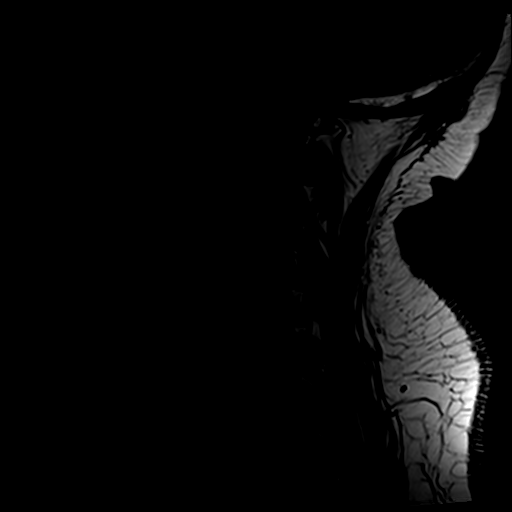
[im 9/12]
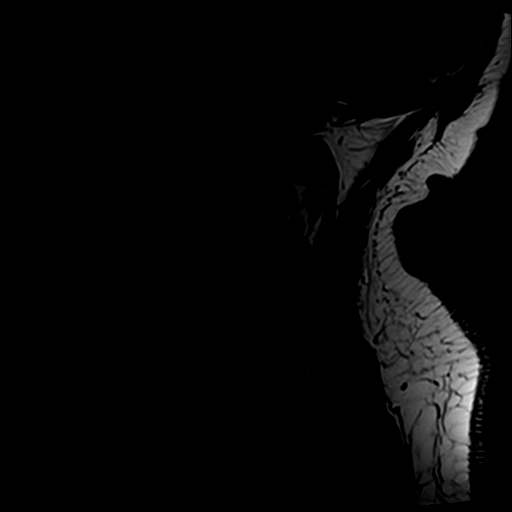
[im 12/12]
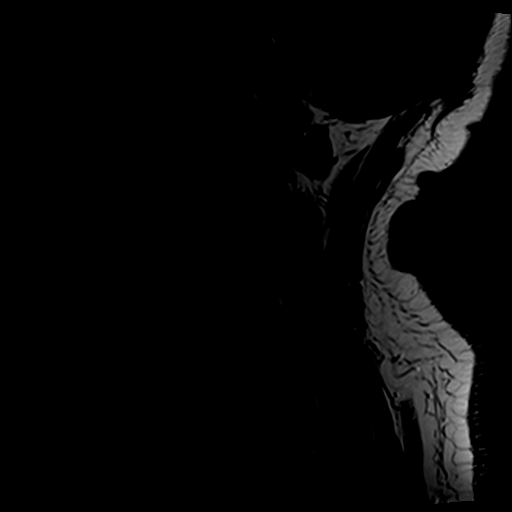

[Series 5: tir sag · sagittal · 3.0mm · 0.41mm/px · 6 of 12 slices shown]
[im 1/12]
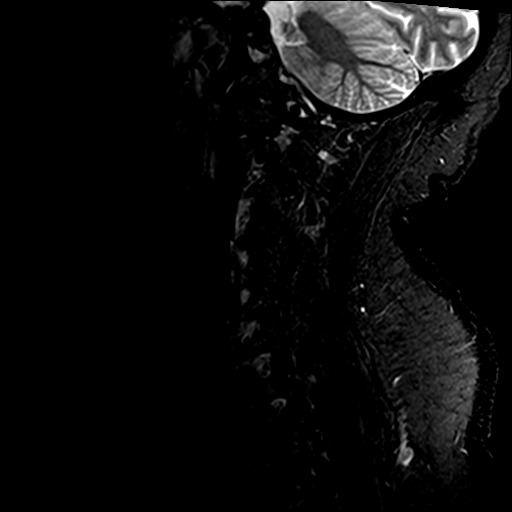
[im 3/12]
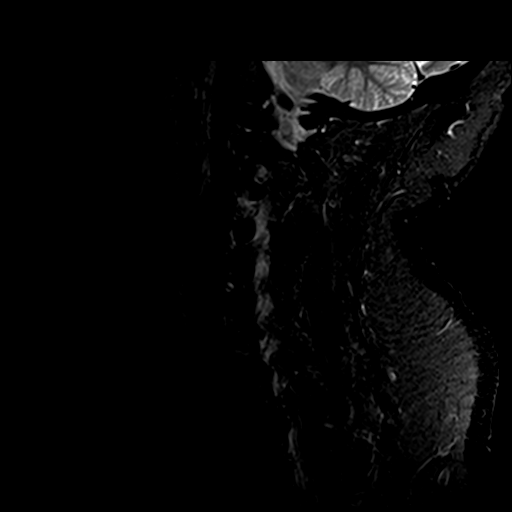
[im 5/12]
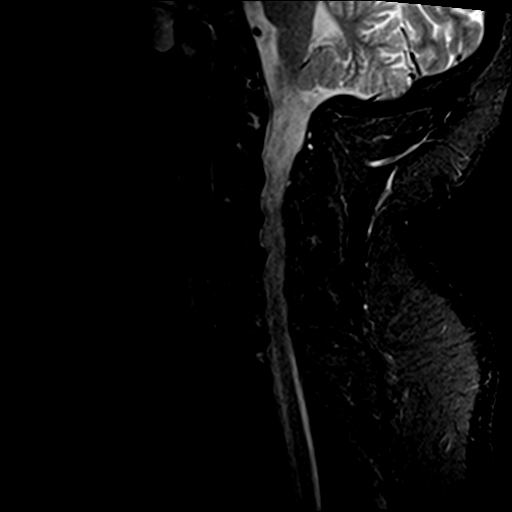
[im 7/12]
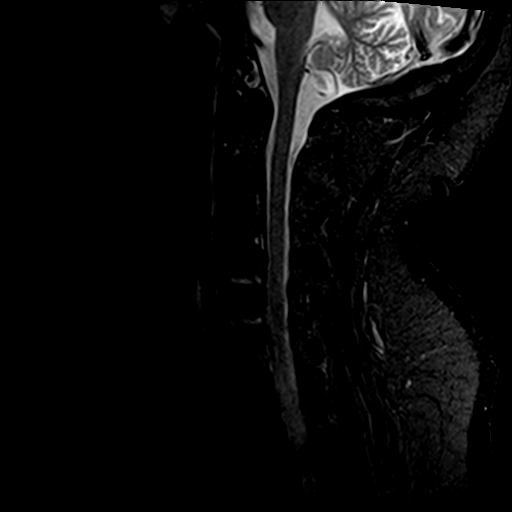
[im 9/12]
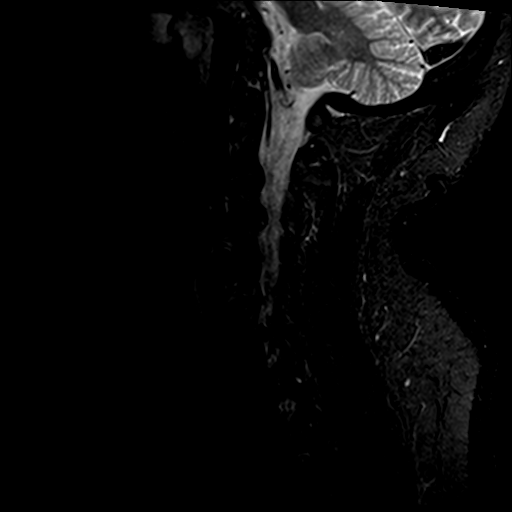
[im 12/12]
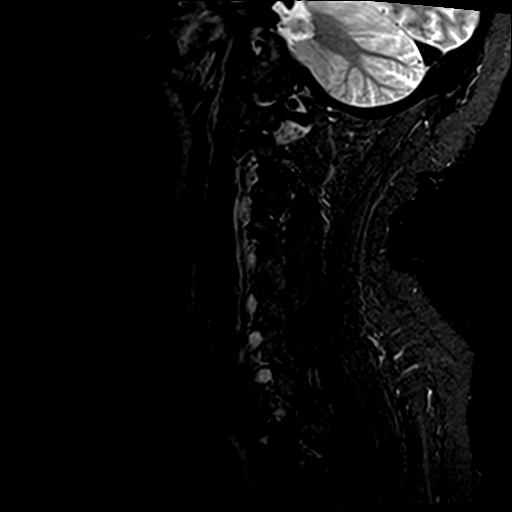

[Series 6: GRE · axial · 3.0mm · 0.35mm/px · 1 of 28 slices shown]
[im 1/28]
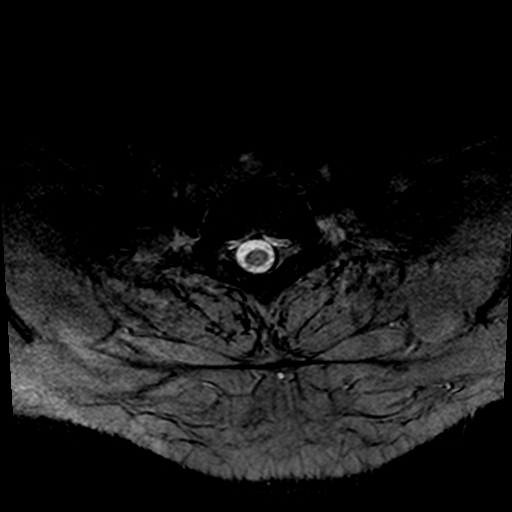

[Series 7: T2 · axial · 3.0mm · 0.70mm/px · z∈[-97,+1]mm · 9 of 28 slices shown (2 of 2)]
[im 1/28]
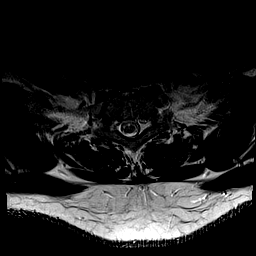
[im 4/28]
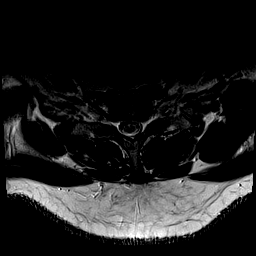
[im 8/28]
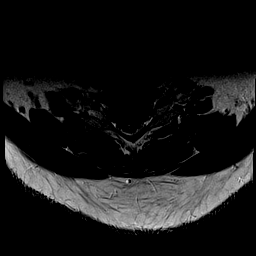
[im 12/28]
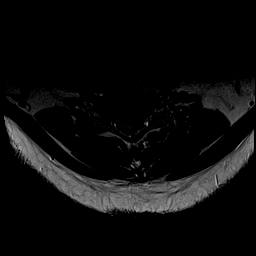
[im 14/28]
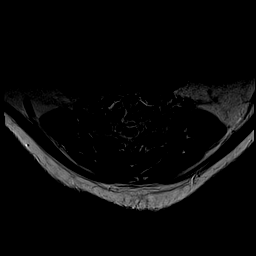
[im 16/28]
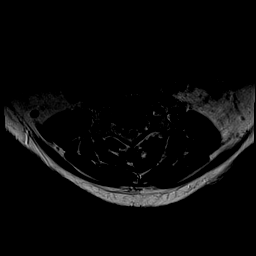
[im 20/28]
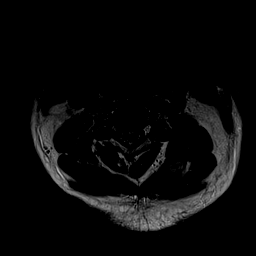
[im 24/28]
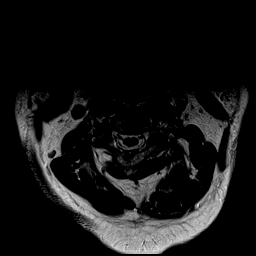
[im 28/28]
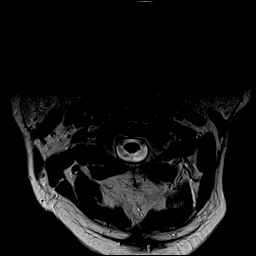

[28 of 48 positions shown; findings below may reference images not displayed]

FINDINGS: Alignment: Straightening of the cervical curvature.

Vertebrae: No fracture, evidence of discitis, or bone lesion.

Cord: Normal signal and morphology.

Posterior Fossa, vertebral arteries, paraspinal tissues: Negative.

Disc levels:

C2-3: Facet degenerative changes. No significant spinal canal or
neural foraminal stenosis.

C3-4:Posterior disc protrusion without significant spinal canal
stenosis. Right uncovertebral and mild bilateral facet degenerative
changes resulting in moderate right neural foraminal narrowing.

C4-5: Posterior disc protrusion resulting in mild spinal canal
stenosis. Uncovertebral and facet degenerative changes result
mild-to-moderate left neural foraminal narrowing.

C5-6: Posterior disc protrusion resulting in mild spinal canal
stenosis. Uncovertebral and facet degenerative changes resulting in
mild-to-moderate right neural foraminal narrowing.

C6-7: Posterior disc protrusion without significant spinal canal
stenosis. Right uncovertebral and bilateral facet degenerative
changes resulting in mild right neural foraminal narrowing.

C7-T1: Facet degenerative changes. No significant spinal canal or
neural foraminal stenosis.
IMPRESSION: 1. Multilevel cervical spondylosis as described above, with mild
spinal canal stenosis at C4-5 and C5-6.
2. Multilevel neural foraminal narrowing, moderate on the right at
C3-4 and mild-to-moderate on the left at C4-5 and on the right at
C5-6.

## 2021-01-05 MED ORDER — CELECOXIB 200 MG PO CAPS: 200.0000 mg | ORAL_CAPSULE | Freq: Two times a day (BID) | ORAL | 6 refills | Status: DC | PRN
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
## 2021-01-10 ENCOUNTER — Other Ambulatory Visit: Payer: Self-pay

## 2021-01-10 ENCOUNTER — Encounter: Payer: Self-pay | Admitting: Family Medicine

## 2021-01-10 ENCOUNTER — Ambulatory Visit (INDEPENDENT_AMBULATORY_CARE_PROVIDER_SITE_OTHER): Payer: Medicare Other | Admitting: Family Medicine

## 2021-01-10 DIAGNOSIS — M79642 Pain in left hand: Secondary | ICD-10-CM | POA: Diagnosis not present
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
## 2021-01-10 NOTE — Addendum Note (Signed)
Addended by: Lillia Carmel on: 01/10/2021 09:09 AM   Modules accepted: Orders

## 2021-01-18 ENCOUNTER — Other Ambulatory Visit: Payer: Self-pay

## 2021-01-18 ENCOUNTER — Ambulatory Visit (INDEPENDENT_AMBULATORY_CARE_PROVIDER_SITE_OTHER): Payer: Medicare Other | Admitting: Physical Medicine and Rehabilitation

## 2021-01-18 ENCOUNTER — Encounter: Payer: Self-pay | Admitting: Physical Medicine and Rehabilitation

## 2021-01-18 VITALS — BP 156/87 | HR 69

## 2021-01-18 DIAGNOSIS — M5412 Radiculopathy, cervical region: Secondary | ICD-10-CM

## 2021-01-18 DIAGNOSIS — M5010 Cervical disc disorder with radiculopathy, unspecified cervical region: Secondary | ICD-10-CM | POA: Diagnosis not present

## 2021-01-18 DIAGNOSIS — F4310 Post-traumatic stress disorder, unspecified: Secondary | ICD-10-CM

## 2021-01-18 DIAGNOSIS — M542 Cervicalgia: Secondary | ICD-10-CM

## 2021-01-18 DIAGNOSIS — G894 Chronic pain syndrome: Secondary | ICD-10-CM | POA: Diagnosis not present

## 2021-01-18 NOTE — Progress Notes (Unsigned)
Lower neck pain. Pain radiates into shoulders and arms. Numbness and tingling.  Numeric Pain Rating Scale and Functional Assessment Average Pain 10   In the last MONTH (on 0-10 scale) has pain interfered with the following?  1. General activity like being  able to carry out your everyday physical activities such as walking, climbing stairs, carrying groceries, or moving a chair?  Rating(10)

## 2021-01-19 ENCOUNTER — Ambulatory Visit (INDEPENDENT_AMBULATORY_CARE_PROVIDER_SITE_OTHER): Payer: Medicare Other | Admitting: Physical Medicine and Rehabilitation

## 2021-01-19 ENCOUNTER — Ambulatory Visit: Payer: Self-pay

## 2021-01-19 ENCOUNTER — Encounter: Payer: Self-pay | Admitting: Physical Medicine and Rehabilitation

## 2021-01-19 VITALS — BP 151/85 | HR 75

## 2021-01-19 DIAGNOSIS — G894 Chronic pain syndrome: Secondary | ICD-10-CM | POA: Insufficient documentation

## 2021-01-19 DIAGNOSIS — M5010 Cervical disc disorder with radiculopathy, unspecified cervical region: Secondary | ICD-10-CM | POA: Insufficient documentation

## 2021-01-19 DIAGNOSIS — M542 Cervicalgia: Secondary | ICD-10-CM | POA: Insufficient documentation

## 2021-01-19 DIAGNOSIS — M5412 Radiculopathy, cervical region: Secondary | ICD-10-CM | POA: Diagnosis not present

## 2021-01-19 DIAGNOSIS — F4310 Post-traumatic stress disorder, unspecified: Secondary | ICD-10-CM | POA: Insufficient documentation

## 2021-01-19 MED ORDER — BETAMETHASONE SOD PHOS & ACET 6 (3-3) MG/ML IJ SUSP
12.0000 mg | Freq: Once | INTRAMUSCULAR | Status: AC
  Administered 2021-01-19: 12 mg

## 2021-01-19 NOTE — Procedures (Signed)
Cervical Epidural Steroid Injection - Interlaminar Approach with Fluoroscopic Guidance  Patient: Tony [name]      Date of Birth: October 03, 1951 MRN: [id number] PCP: Pcp, No      Visit Date: 01/19/2021   Universal Protocol:    Date/Time: 02/03/222:54 PM  Consent Given By: the patient  Position: PRONE  Additional Comments: Vital signs were monitored before and after the procedure. Patient was prepped and draped in the usual sterile fashion. The correct patient, procedure, and site was verified.   Injection Procedure Details:   Procedure diagnoses: Cervical radiculopathy [M54.12]    Meds Administered:  Meds ordered this encounter  Medications  . betamethasone acetate-betamethasone sodium phosphate (CELESTONE) injection 12 mg     Laterality: Left  Location/Site: C7-T1  Needle: 3.5 in., 20 ga. Tuohy  Needle Placement: Paramedian epidural space  Findings:  -Comments: Excellent flow of contrast into the epidural space.  Procedure Details: Using a paramedian approach from the side mentioned above, the region overlying the inferior lamina was localized under fluoroscopic visualization and the soft tissues overlying this structure were infiltrated with 4 ml. of 1% Lidocaine without Epinephrine. A # 20 gauge, Tuohy needle was inserted into the epidural space using a paramedian approach.  The epidural space was localized using loss of resistance along with contralateral oblique bi-planar fluoroscopic views.  After negative aspirate for air, blood, and CSF, a 2 ml. volume of Isovue-250 was injected into the epidural space and the flow of contrast was observed. Radiographs were obtained for documentation purposes.   The injectate was administered into the level noted above.  Additional Comments:  The patient tolerated the procedure well Dressing: 2 x 2 sterile gauze and Band-Aid    Post-procedure details: Patient was observed during the procedure. Post-procedure instructions  were reviewed.  Patient left the clinic in stable condition.
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
## 2021-01-19 NOTE — Progress Notes (Signed)
Tony [name] - 70 y.o. male MRN [id number]  Date of birth: 1951-09-21  Office Visit Note: Visit Date: 01/19/2021 PCP: Pcp, No Referred by: No ref. provider found  Subjective: Chief Complaint  Patient presents with  . Neck - Pain   HPI:  Tony [name] is a 70 y.o. male who comes in today For planned Left C7-T1 Cervical epidural steroid injection with fluoroscopic guidance.  The patient has failed conservative care including home exercise, medications, time and activity modification.  This injection will be diagnostic and hopefully therapeutic.  Please see requesting physician notes for further details and justification.  MRI reviewed with images and spine model.  MRI reviewed in the note below.  Referring provider: Dr. Casimiro Needle Hilts    ROS Otherwise per HPI.  Assessment & Plan: Visit Diagnoses:    ICD-10-CM   1. Cervical radiculopathy  M54.12 XR C-ARM NO REPORT    Epidural Steroid injection    betamethasone acetate-betamethasone sodium phosphate (CELESTONE) injection 12 mg    Plan: No additional findings.   Meds & Orders:  Meds ordered this encounter  Medications  . betamethasone acetate-betamethasone sodium phosphate (CELESTONE) injection 12 mg    Orders Placed This Encounter  Procedures  . XR C-ARM NO REPORT  . Epidural Steroid injection    Follow-up: Return if symptoms worsen or fail to improve.   Procedures: No procedures performed  Cervical Epidural Steroid Injection - Interlaminar Approach with Fluoroscopic Guidance  Patient: Tony [name]      Date of Birth: July 26, 1951 MRN: [id number] PCP: Pcp, No      Visit Date: 01/19/2021   Universal Protocol:    Date/Time: 02/03/222:54 PM  Consent Given By: the patient  Position: PRONE  Additional Comments: Vital signs were monitored before and after the procedure. Patient was prepped and draped in the usual sterile fashion. The correct patient, procedure, and site was verified.   Injection Procedure  Details:   Procedure diagnoses: Cervical radiculopathy [M54.12]    Meds Administered:  Meds ordered this encounter  Medications  . betamethasone acetate-betamethasone sodium phosphate (CELESTONE) injection 12 mg     Laterality: Left  Location/Site: C7-T1  Needle: 3.5 in., 20 ga. Tuohy  Needle Placement: Paramedian epidural space  Findings:  -Comments: Excellent flow of contrast into the epidural space.  Procedure Details: Using a paramedian approach from the side mentioned above, the region overlying the inferior lamina was localized under fluoroscopic visualization and the soft tissues overlying this structure were infiltrated with 4 ml. of 1% Lidocaine without Epinephrine. A # 20 gauge, Tuohy needle was inserted into the epidural space using a paramedian approach.  The epidural space was localized using loss of resistance along with contralateral oblique bi-planar fluoroscopic views.  After negative aspirate for air, blood, and CSF, a 2 ml. volume of Isovue-250 was injected into the epidural space and the flow of contrast was observed. Radiographs were obtained for documentation purposes.   The injectate was administered into the level noted above.  Additional Comments:  The patient tolerated the procedure well Dressing: 2 x 2 sterile gauze and Band-Aid    Post-procedure details: Patient was observed during the procedure. Post-procedure instructions were reviewed.  Patient left the clinic in stable condition.     Clinical History: MRI CERVICAL SPINE WITHOUT CONTRAST  TECHNIQUE: Multiplanar, multisequence MR imaging of the cervical spine was performed. No intravenous contrast was administered.  COMPARISON:  Plain films December 29, 2020.  FINDINGS: Alignment: Straightening of the cervical curvature.  Vertebrae: No fracture, evidence  of discitis, or bone lesion.  Cord: Normal signal and morphology.  Posterior Fossa, vertebral arteries, paraspinal tissues:  Negative.  Disc levels:  C2-3: Facet degenerative changes. No significant spinal canal or neural foraminal stenosis.  C3-4:Posterior disc protrusion without significant spinal canal stenosis. Right uncovertebral and mild bilateral facet degenerative changes resulting in moderate right neural foraminal narrowing.  C4-5: Posterior disc protrusion resulting in mild spinal canal stenosis. Uncovertebral and facet degenerative changes result mild-to-moderate left neural foraminal narrowing.  C5-6: Posterior disc protrusion resulting in mild spinal canal stenosis. Uncovertebral and facet degenerative changes resulting in mild-to-moderate right neural foraminal narrowing.  C6-7: Posterior disc protrusion without significant spinal canal stenosis. Right uncovertebral and bilateral facet degenerative changes resulting in mild right neural foraminal narrowing.  C7-T1: Facet degenerative changes. No significant spinal canal or neural foraminal stenosis.  IMPRESSION: 1. Multilevel cervical spondylosis as described above, with mild spinal canal stenosis at C4-5 and C5-6. 2. Multilevel neural foraminal narrowing, moderate on the right at C3-4 and mild-to-moderate on the left at C4-5 and on the right at C5-6.   Electronically Signed   By: Baldemar Lenis M.D.   On: 01/05/2021 15:05 ---- NCV & EMG Findings: 11/10/2019 Extensive electrodiagnostic testing of the right lower extremity and additional studies of the left shows:  1. Bilateral sural and superficial peroneal sensory responses are within normal limits. 2. Bilateral peroneal and right tibial motor responses are within normal limits. Left tibial motor response shows reduced amplitude (2.5 mV). 3. Bilateral tibial H reflex studies are within normal limits. 4. 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  Impression: 1. Chronic S1 radiculopathy  affecting the left lower extremity, mild. 2. There is no evidence of a large fiber sensorimotor neuropathy affecting the lower extremities.   ___________________________ Nita Sickle, DO  --------- MRI LUMBAR SPINE WITHOUT CONTRAST    TECHNIQUE:  Multiplanar, multisequence MR imaging of the lumbar spine was  performed. No intravenous contrast was administered.    COMPARISON: None.    FINDINGS:  Segmentation: Standard.    Alignment: Straightening of the lumbar lordosis. Anteroposterior  alignment is maintained.    Vertebrae: Vertebral body heights are maintained apart from  degenerative endplate irregularity at L4 and L5. There is no  significant marrow edema. No suspicious osseous lesion.    Conus medullaris and cauda equina: Conus extends to the L1-L2 level.  Conus and cauda equina appear normal.    Paraspinal and other soft tissues: Unremarkable.    Disc levels:    L1-L2: No canal or foraminal stenosis.    L2-L3: No canal or foraminal stenosis.    L3-L4: No canal or foraminal stenosis.    L4-L5: Disc desiccation and mild disc height loss. Disc bulge and  mild facet arthropathy. No canal stenosis. Mild to moderate right  and mild left foraminal stenosis.    L5-S1: Disc desiccation and moderate disc height loss. Disc bulge  with endplate osteophytic ridging. Prominent circumferential  epidural fat partially effacing the thecal sac. No significant right  foraminal stenosis. Mild to moderate left foraminal stenosis.    IMPRESSION:  Lower lumbar degenerative changes as detailed above.      Electronically Signed  By: Guadlupe Spanish M.D.  On: 11/04/2019 13:05     Objective:  VS:  HT:    WT:   BMI:     BP:(!) 151/85  HR:75bpm  TEMP: ( )  RESP:  Physical Exam Vitals and nursing note  reviewed.  Constitutional:      General: He is not in acute distress.    Appearance: Normal appearance. He is not ill-appearing.  HENT:     Head:  Normocephalic and atraumatic.     Right Ear: External ear normal.     Left Ear: External ear normal.  Eyes:     Extraocular Movements: Extraocular movements intact.  Cardiovascular:     Rate and Rhythm: Normal rate.     Pulses: Normal pulses.  Abdominal:     General: There is no distension.     Palpations: Abdomen is soft.  Musculoskeletal:        General: No signs of injury.     Cervical back: Neck supple. Tenderness present. No rigidity.     Right lower leg: No edema.     Left lower leg: No edema.     Comments: Patient has good strength in the upper extremities with 5 out of 5 strength in wrist extension long finger flexion APB.  No intrinsic hand muscle atrophy.  Negative Hoffmann's test.  Lymphadenopathy:     Cervical: No cervical adenopathy.  Skin:    Findings: No erythema or rash.  Neurological:     General: No focal deficit present.     Mental Status: He is alert and oriented to person, place, and time.     Sensory: No sensory deficit.     Motor: No weakness or abnormal muscle tone.     Coordination: Coordination normal.  Psychiatric:        Mood and Affect: Mood normal.        Behavior: Behavior normal.      Imaging: No results found.
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
## 2021-01-22 ENCOUNTER — Other Ambulatory Visit: Payer: Self-pay

## 2021-01-22 ENCOUNTER — Ambulatory Visit
Admission: RE | Admit: 2021-01-22 | Discharge: 2021-01-22 | Disposition: A | Discharge status: Home / Self Care | Payer: Medicare Other | Source: Ambulatory Visit | Attending: Family Medicine | Admitting: Family Medicine

## 2021-01-22 DIAGNOSIS — M79642 Pain in left hand: Secondary | ICD-10-CM
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
## 2021-01-23 ENCOUNTER — Telehealth: Payer: Self-pay | Admitting: Family Medicine

## 2021-01-23 DIAGNOSIS — M79642 Pain in left hand: Secondary | ICD-10-CM

## 2021-01-23 DIAGNOSIS — M1812 Unilateral primary osteoarthritis of first carpometacarpal joint, left hand: Secondary | ICD-10-CM

## 2021-01-23 NOTE — Telephone Encounter (Signed)
MRI confirms arthritis in the hand/thumb.  I have requested referral to hand surgeon to discuss treatment and the long-term implications of this.

## 2021-01-23 NOTE — Telephone Encounter (Signed)
Has ov tomorrow with Dr. Prince Rome to go over the results.

## 2021-01-24 ENCOUNTER — Ambulatory Visit (INDEPENDENT_AMBULATORY_CARE_PROVIDER_SITE_OTHER): Payer: Medicare Other | Admitting: Family Medicine

## 2021-01-24 ENCOUNTER — Other Ambulatory Visit: Payer: Self-pay

## 2021-01-24 ENCOUNTER — Encounter: Payer: Self-pay | Admitting: Family Medicine

## 2021-01-24 DIAGNOSIS — M79642 Pain in left hand: Secondary | ICD-10-CM
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
## 2021-02-15 ENCOUNTER — Other Ambulatory Visit (HOSPITAL_COMMUNITY): Payer: Self-pay | Admitting: Psychiatry

## 2021-02-21 ENCOUNTER — Telehealth: Payer: Self-pay | Admitting: Family Medicine

## 2021-02-21 ENCOUNTER — Telehealth: Payer: Self-pay | Admitting: Physical Medicine and Rehabilitation

## 2021-02-21 NOTE — Telephone Encounter (Signed)
Left message asking patient to call back to let us know if he wants an appointment for his low back or his neck.
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
## 2021-02-21 NOTE — Telephone Encounter (Signed)
Patient called needing an appointment with Dr Alvester Morin. The number to contact patient is [phone]

## 2021-02-21 NOTE — Telephone Encounter (Signed)
Left C7-T1 IL 01/19/21. Ok to repeat if helped, same problem/side, and no new injury?

## 2021-02-21 NOTE — Telephone Encounter (Signed)
Patient called again for return phone call to [facility]. Please call patient at [phone].

## 2021-02-21 NOTE — Telephone Encounter (Signed)
Pt called stating he will need an appt with Dr. Alvester Morin at the beginning of May but not 04/17/21.  [phone]

## 2021-02-21 NOTE — Telephone Encounter (Signed)
Ok for cervical injection if last helped otherwise Dr. Prince Rome f/up

## 2021-02-21 NOTE — Telephone Encounter (Signed)
Pt would like to see Dr.Newton on the 4th 5th or the 6th in the morning! He would prefer the 4th if available. Cb [phone]

## 2021-02-21 NOTE — Telephone Encounter (Signed)
Pt called back stating he needs to be seen for his neck; Pt states he will be available May 4-6 in the morning.  He would like a CB with the appt time and date, and if he doesn't answer he would like the appt left on his VM  [phone]

## 2021-02-21 NOTE — Telephone Encounter (Signed)
Patient returned call to [facility]. Please call patient at [phone].

## 2021-03-28 ENCOUNTER — Other Ambulatory Visit: Payer: Self-pay

## 2021-03-28 ENCOUNTER — Encounter: Payer: Self-pay | Admitting: Psychology

## 2021-03-28 ENCOUNTER — Encounter: Payer: Medicare Other | Attending: Psychology | Admitting: Psychology

## 2021-03-28 DIAGNOSIS — M5116 Intervertebral disc disorders with radiculopathy, lumbar region: Secondary | ICD-10-CM

## 2021-03-28 DIAGNOSIS — F410 Panic disorder [episodic paroxysmal anxiety] without agoraphobia: Secondary | ICD-10-CM | POA: Insufficient documentation

## 2021-03-28 DIAGNOSIS — F4310 Post-traumatic stress disorder, unspecified: Secondary | ICD-10-CM | POA: Diagnosis not present

## 2021-03-28 DIAGNOSIS — M5416 Radiculopathy, lumbar region: Secondary | ICD-10-CM
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
## 2021-03-29 ENCOUNTER — Telehealth: Payer: Self-pay

## 2021-03-29 NOTE — Telephone Encounter (Signed)
[name] , Tony called and said he had discussed referring to the doctor in [place]  But he had to cancel his appt and reschedule later on and he just wanted you to know because he felt it was important.

## 2021-04-03 ENCOUNTER — Encounter (HOSPITAL_COMMUNITY): Payer: Self-pay | Admitting: Psychiatry

## 2021-04-03 ENCOUNTER — Telehealth (INDEPENDENT_AMBULATORY_CARE_PROVIDER_SITE_OTHER): Payer: Medicare Other | Admitting: Psychiatry

## 2021-04-03 ENCOUNTER — Other Ambulatory Visit (HOSPITAL_COMMUNITY): Payer: Self-pay

## 2021-04-03 DIAGNOSIS — F410 Panic disorder [episodic paroxysmal anxiety] without agoraphobia: Secondary | ICD-10-CM | POA: Diagnosis not present

## 2021-04-03 DIAGNOSIS — F4310 Post-traumatic stress disorder, unspecified: Secondary | ICD-10-CM

## 2021-04-03 MED ORDER — ESCITALOPRAM OXALATE 10 MG PO TABS: 1.0000 | ORAL_TABLET | Freq: Every day | ORAL | 0 refills | Status: DC

## 2021-04-03 MED ORDER — ESCITALOPRAM OXALATE 10 MG PO TABS: 10.0000 mg | ORAL_TABLET | Freq: Every day | ORAL | 0 refills | Status: DC
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
## 2021-04-17 ENCOUNTER — Other Ambulatory Visit: Payer: Self-pay | Admitting: Orthopedic Surgery

## 2021-04-18 ENCOUNTER — Telehealth: Payer: Self-pay | Admitting: Physical Medicine and Rehabilitation

## 2021-04-18 ENCOUNTER — Other Ambulatory Visit: Payer: Self-pay

## 2021-04-18 ENCOUNTER — Ambulatory Visit (INDEPENDENT_AMBULATORY_CARE_PROVIDER_SITE_OTHER): Payer: Medicare Other | Admitting: Family Medicine

## 2021-04-18 ENCOUNTER — Encounter: Payer: Self-pay | Admitting: Family Medicine

## 2021-04-18 DIAGNOSIS — M545 Low back pain, unspecified: Secondary | ICD-10-CM

## 2021-04-18 NOTE — Telephone Encounter (Signed)
Pt called asking for a CB to set an appt in Aug. With Dr. Alvester Morin. He would like a CB today if possible.   [phone]

## 2021-04-18 NOTE — Progress Notes (Signed)
   Office Visit Note   Patient: Tony [name]           Date of Birth: [birth year]           MRN: [id number] Visit Date: 04/18/2021 Requested by: No referring provider defined for this encounter. PCP: Pcp, No  Subjective: Chief Complaint  Patient presents with  . Lower Back - Pain, Follow-up    Requesting trigger point injections    HPI: He is here with low back pain.  Trigger point injections in January helped him until recently.  He would like to have them again on both sides.  He is scheduled for left hand surgery later this month.  Once he is sufficiently recovered from hand surgery, he plans to return to [place].                ROS:   All other systems were reviewed and are negative.  Objective: Vital Signs: There were no vitals taken for this visit.  Physical Exam:  General:  Alert and oriented, in no acute distress. Pulm:  Breathing unlabored. Psy:  Normal mood, congruent affect.  Low back: He has tender bilateral trigger points in the quadratus lumborum region, 2 on each side.  Imaging: No results found.  Assessment & Plan: 1.  Myofascial low back pain -Trigger point injections today.  Follow-up as needed for this.     Procedures: Low back injections: After sterile prep with Betadine, injected a total of 4 cc 0.25% bupivacaine and 40 mg Depo-Medrol on each side, half in each trigger point.       PMFS History: Patient Active Problem List   Diagnosis Date Noted  . Cervicalgia 01/19/2021  . Cervical disc disorder with radiculopathy 01/19/2021  . Chronic pain syndrome 01/19/2021  . PTSD (post-traumatic stress disorder) 01/19/2021   Past Medical History:  Diagnosis Date  . Back pain   . Diabetes mellitus without complication (HCC)   . Hypertension     History reviewed. No pertinent family history.  History reviewed. No pertinent surgical history. Social History   Occupational History  . Not on file  Tobacco Use  . Smoking status: Never Smoker   . Smokeless tobacco: Never Used  Substance and Sexual Activity  . Alcohol use: Not on file  . Drug use: Not on file  . Sexual activity: Not on file

## 2021-04-18 NOTE — Telephone Encounter (Signed)
Patient asking for a call back to set appt with Dr. Alvester Morin. Patient phone number is [phone]

## 2021-04-19 NOTE — Telephone Encounter (Signed)
Pt has appt tomorrow but he wants to sch another inj in aug. Please Advise.

## 2021-04-20 ENCOUNTER — Ambulatory Visit: Payer: Self-pay

## 2021-04-20 ENCOUNTER — Encounter: Payer: Self-pay | Admitting: Physical Medicine and Rehabilitation

## 2021-04-20 ENCOUNTER — Ambulatory Visit (INDEPENDENT_AMBULATORY_CARE_PROVIDER_SITE_OTHER): Payer: Medicare Other | Admitting: Physical Medicine and Rehabilitation

## 2021-04-20 ENCOUNTER — Other Ambulatory Visit: Payer: Self-pay

## 2021-04-20 VITALS — BP 163/95 | HR 64

## 2021-04-20 DIAGNOSIS — M5412 Radiculopathy, cervical region: Secondary | ICD-10-CM | POA: Diagnosis not present

## 2021-04-20 MED ORDER — BETAMETHASONE SOD PHOS & ACET 6 (3-3) MG/ML IJ SUSP
12.0000 mg | Freq: Once | INTRAMUSCULAR | Status: AC
  Administered 2021-04-20: 12 mg

## 2021-04-20 NOTE — Patient Instructions (Signed)

## 2021-04-20 NOTE — Progress Notes (Signed)
Pt state neck that travels to his head and shoulders pain. Pt state everything makes the pain worse. Pt state he take pain meds to help ease his pain. Pt has hx of inj on 01/19/21 pt state it helped for four weeks.  Numeric Pain Rating Scale and Functional Assessment Average Pain 10   In the last MONTH (on 0-10 scale) has pain interfered with the following?  1. General activity like being  able to carry out your everyday physical activities such as walking, climbing stairs, carrying groceries, or moving a chair?  Rating(9)   +Driver, -BT, -Dye Allergies.

## 2021-04-20 NOTE — Procedures (Signed)
Cervical Epidural Steroid Injection - Interlaminar Approach with Fluoroscopic Guidance  Patient: Tony [name]      Date of Birth: 07-22-1951 MRN: [id number] PCP: Pcp, No      Visit Date: 04/20/2021   Universal Protocol:    Date/Time: 05/05/228:25 AM  Consent Given By: the patient  Position: PRONE  Additional Comments: Vital signs were monitored before and after the procedure. Patient was prepped and draped in the usual sterile fashion. The correct patient, procedure, and site was verified.   Injection Procedure Details:   Procedure diagnoses: Cervical radiculopathy [M54.12]    Meds Administered:  Meds ordered this encounter  Medications  . betamethasone acetate-betamethasone sodium phosphate (CELESTONE) injection 12 mg     Laterality: Left  Location/Site: C7-T1  Needle: 3.5 in., 20 ga. Tuohy  Needle Placement: Paramedian epidural space  Findings:  -Comments: Excellent flow of contrast into the epidural space.  Procedure Details: Using a paramedian approach from the side mentioned above, the region overlying the inferior lamina was localized under fluoroscopic visualization and the soft tissues overlying this structure were infiltrated with 4 ml. of 1% Lidocaine without Epinephrine. A # 20 gauge, Tuohy needle was inserted into the epidural space using a paramedian approach.  The epidural space was localized using loss of resistance along with contralateral oblique bi-planar fluoroscopic views.  After negative aspirate for air, blood, and CSF, a 2 ml. volume of Isovue-250 was injected into the epidural space and the flow of contrast was observed. Radiographs were obtained for documentation purposes.   The injectate was administered into the level noted above.  Additional Comments:  The patient tolerated the procedure well Dressing: 2 x 2 sterile gauze and Band-Aid    Post-procedure details: Patient was observed during the procedure. Post-procedure instructions  were reviewed.  Patient left the clinic in stable condition.

## 2021-04-20 NOTE — Progress Notes (Signed)
Tony [name] - 70 y.o. male MRN [id number]  Date of birth: 1951/07/07  Office Visit Note: Visit Date: 04/20/2021 PCP: Pcp, No Referred by: No ref. provider found  Subjective: Chief Complaint  Patient presents with  . Neck - Pain  . Left Shoulder - Pain  . Right Shoulder - Pain  . Head - Pain   HPI:  Tony [name] is a 70 y.o. male who comes in today for planned repeat Left C7-T1 Cervical epidural steroid injection with fluoroscopic guidance.  The patient has failed conservative care including home exercise, medications, time and activity modification.  This injection will be diagnostic and hopefully therapeutic.  Please see requesting physician notes for further details and justification. Patient received more than 50% pain relief from prior injection. MRI reviewed with images and spine model.  MRI reviewed in the note below.   Referring: Dr. Casimiro Needle Hilts    ROS Otherwise per HPI.  Assessment & Plan: Visit Diagnoses: No diagnosis found.  Plan: No additional findings.   Meds & Orders: No orders of the defined types were placed in this encounter.  No orders of the defined types were placed in this encounter.   Follow-up: No follow-ups on file.   Procedures: No procedures performed      Clinical History: MRI CERVICAL SPINE WITHOUT CONTRAST  TECHNIQUE: Multiplanar, multisequence MR imaging of the cervical spine was performed. No intravenous contrast was administered.  COMPARISON:  Plain films December 29, 2020.  FINDINGS: Alignment: Straightening of the cervical curvature.  Vertebrae: No fracture, evidence of discitis, or bone lesion.  Cord: Normal signal and morphology.  Posterior Fossa, vertebral arteries, paraspinal tissues: Negative.  Disc levels:  C2-3: Facet degenerative changes. No significant spinal canal or neural foraminal stenosis.  C3-4:Posterior disc protrusion without significant spinal canal stenosis. Right uncovertebral and mild  bilateral facet degenerative changes resulting in moderate right neural foraminal narrowing.  C4-5: Posterior disc protrusion resulting in mild spinal canal stenosis. Uncovertebral and facet degenerative changes result mild-to-moderate left neural foraminal narrowing.  C5-6: Posterior disc protrusion resulting in mild spinal canal stenosis. Uncovertebral and facet degenerative changes resulting in mild-to-moderate right neural foraminal narrowing.  C6-7: Posterior disc protrusion without significant spinal canal stenosis. Right uncovertebral and bilateral facet degenerative changes resulting in mild right neural foraminal narrowing.  C7-T1: Facet degenerative changes. No significant spinal canal or neural foraminal stenosis.  IMPRESSION: 1. Multilevel cervical spondylosis as described above, with mild spinal canal stenosis at C4-5 and C5-6. 2. Multilevel neural foraminal narrowing, moderate on the right at C3-4 and mild-to-moderate on the left at C4-5 and on the right at C5-6.   Electronically Signed   By: Baldemar Lenis M.D.   On: 01/05/2021 15:05 ---- NCV & EMG Findings: 11/10/2019 Extensive electrodiagnostic testing of the right lower extremity and additional studies of the left shows:  1. Bilateral sural and superficial peroneal sensory responses are within normal limits. 2. Bilateral peroneal and right tibial motor responses are within normal limits. Left tibial motor response shows reduced amplitude (2.5 mV). 3. Bilateral tibial H reflex studies are within normal limits. 4. 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  Impression: 1. 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 2. There is no evidence of a large fiber sensorimotor neuropathy affecting the lower extremities.   ___________________________ Nita Sickle, DO  --------- MRI LUMBAR SPINE WITHOUT  CONTRAST    TECHNIQUE:  Multiplanar, multisequence MR imaging of the  lumbar spine was  performed. No intravenous contrast was administered.    COMPARISON: None.    FINDINGS:  Segmentation: Standard.    Alignment: Straightening of the lumbar lordosis. Anteroposterior  alignment is maintained.    Vertebrae: Vertebral body heights are maintained apart from  degenerative endplate irregularity at L4 and L5. There is no  significant marrow edema. No suspicious osseous lesion.    Conus medullaris and cauda equina: Conus extends to the L1-L2 level.  Conus and cauda equina appear normal.    Paraspinal and other soft tissues: Unremarkable.    Disc levels:    L1-L2: No canal or foraminal stenosis.    L2-L3: No canal or foraminal stenosis.    L3-L4: No canal or foraminal stenosis.    L4-L5: Disc desiccation and mild disc height loss. Disc bulge and  mild facet arthropathy. No canal stenosis. Mild to moderate right  and mild left foraminal stenosis.    L5-S1: Disc desiccation and moderate disc height loss. Disc bulge  with endplate osteophytic ridging. Prominent circumferential  epidural fat partially effacing the thecal sac. No significant right  foraminal stenosis. Mild to moderate left foraminal stenosis.    IMPRESSION:  Lower lumbar degenerative changes as detailed above.      Electronically Signed  By: Guadlupe Spanish M.D.  On: 11/04/2019 13:05     Objective:  VS:  HT:    WT:   BMI:     BP:(!) 163/95  HR:64bpm  TEMP: ( )  RESP:  Physical Exam Vitals and nursing note reviewed.  Constitutional:      General: He is not in acute distress.    Appearance: Normal appearance. He is not ill-appearing.  HENT:     Head: Normocephalic and atraumatic.     Right Ear: External ear normal.     Left Ear: External ear normal.  Eyes:     Extraocular Movements: Extraocular movements intact.  Cardiovascular:     Rate and Rhythm: Normal rate.      Pulses: Normal pulses.  Abdominal:     General: There is no distension.     Palpations: Abdomen is soft.  Musculoskeletal:        General: No signs of injury.     Cervical back: Neck supple. Tenderness present. No rigidity.     Right lower leg: No edema.     Left lower leg: No edema.     Comments: Patient has good strength in the upper extremities with 5 out of 5 strength in wrist extension long finger flexion APB.  No intrinsic hand muscle atrophy.  Negative Hoffmann's test.  Lymphadenopathy:     Cervical: No cervical adenopathy.  Skin:    Findings: No erythema or rash.  Neurological:     General: No focal deficit present.     Mental Status: He is alert and oriented to person, place, and time.     Sensory: No sensory deficit.     Motor: No weakness or abnormal muscle tone.     Coordination: Coordination normal.  Psychiatric:        Mood and Affect: Mood normal.        Behavior: Behavior normal.      Imaging: No results found.

## 2021-05-03 ENCOUNTER — Encounter (HOSPITAL_BASED_OUTPATIENT_CLINIC_OR_DEPARTMENT_OTHER): Payer: Self-pay | Admitting: Orthopedic Surgery

## 2021-05-03 ENCOUNTER — Other Ambulatory Visit: Payer: Self-pay

## 2021-05-08 ENCOUNTER — Encounter (HOSPITAL_BASED_OUTPATIENT_CLINIC_OR_DEPARTMENT_OTHER)
Admission: RE | Admit: 2021-05-08 | Discharge: 2021-05-08 | Disposition: A | Discharge status: Home / Self Care | Payer: Medicare Other | Source: Ambulatory Visit | Attending: Orthopedic Surgery | Admitting: Orthopedic Surgery

## 2021-05-08 DIAGNOSIS — Z01818 Encounter for other preprocedural examination: Secondary | ICD-10-CM | POA: Insufficient documentation

## 2021-05-08 LAB — BASIC METABOLIC PANEL
Anion gap: 7 (ref 5–15)
BUN: 15 mg/dL (ref 8–23)
CO2: 26 mmol/L (ref 22–32)
Calcium: 8.8 mg/dL — ABNORMAL LOW (ref 8.9–10.3)
Chloride: 108 mmol/L (ref 98–111)
Creatinine, Ser: 1.07 mg/dL (ref 0.61–1.24)
GFR, Estimated: >60 mL/min (ref >60)
Glucose, Bld: 80 mg/dL (ref 70–99)
Potassium: 4 mmol/L (ref 3.5–5.1)
Sodium: 141 mmol/L (ref 135–145)

## 2021-05-08 NOTE — Progress Notes (Signed)

## 2021-05-11 ENCOUNTER — Ambulatory Visit (HOSPITAL_BASED_OUTPATIENT_CLINIC_OR_DEPARTMENT_OTHER): Payer: Medicare Other | Admitting: Anesthesiology

## 2021-05-11 ENCOUNTER — Encounter (HOSPITAL_BASED_OUTPATIENT_CLINIC_OR_DEPARTMENT_OTHER): Payer: Self-pay | Admitting: Orthopedic Surgery

## 2021-05-11 ENCOUNTER — Encounter (HOSPITAL_BASED_OUTPATIENT_CLINIC_OR_DEPARTMENT_OTHER)
Admission: RE | Disposition: A | Discharge status: Home / Self Care | Payer: Self-pay | Source: Home / Self Care | Attending: Orthopedic Surgery

## 2021-05-11 ENCOUNTER — Other Ambulatory Visit: Payer: Self-pay

## 2021-05-11 ENCOUNTER — Ambulatory Visit (HOSPITAL_BASED_OUTPATIENT_CLINIC_OR_DEPARTMENT_OTHER)
Admission: RE | Admit: 2021-05-11 | Discharge: 2021-05-11 | Disposition: A | Discharge status: Home / Self Care | Payer: Medicare Other | Attending: Orthopedic Surgery | Admitting: Orthopedic Surgery

## 2021-05-11 DIAGNOSIS — M1812 Unilateral primary osteoarthritis of first carpometacarpal joint, left hand: Secondary | ICD-10-CM | POA: Diagnosis present

## 2021-05-11 DIAGNOSIS — E119 Type 2 diabetes mellitus without complications: Secondary | ICD-10-CM | POA: Diagnosis not present

## 2021-05-11 HISTORY — DX: Post-traumatic stress disorder, unspecified: F43.10

## 2021-05-11 HISTORY — DX: Anxiety disorder, unspecified: F41.9

## 2021-05-11 HISTORY — PX: CARPOMETACARPEL SUSPENSION PLASTY: SHX5005

## 2021-05-11 LAB — GLUCOSE, CAPILLARY
Glucose-Capillary: 86 mg/dL (ref 70–99)
Glucose-Capillary: 79 mg/dL (ref 70–99)

## 2021-05-11 SURGERY — CARPOMETACARPEL (CMC) SUSPENSION PLASTY
Anesthesia: General | Site: Wrist | Laterality: Left

## 2021-05-11 MED ORDER — MIDAZOLAM HCL 2 MG/2ML IJ SOLN
2.0000 mg | Freq: Once | INTRAMUSCULAR | Status: AC
  Administered 2021-05-11: 2 mg via INTRAVENOUS

## 2021-05-11 MED ORDER — CEFAZOLIN SODIUM-DEXTROSE 2-3 GM-%(50ML) IV SOLR
INTRAVENOUS | Status: DC | PRN
  Administered 2021-05-11: 2 g via INTRAVENOUS

## 2021-05-11 MED ORDER — MEPERIDINE HCL 25 MG/ML IJ SOLN: 6.2500 mg | INTRAMUSCULAR | Status: DC | PRN

## 2021-05-11 MED ORDER — BUPIVACAINE-EPINEPHRINE (PF) 0.5% -1:200000 IJ SOLN
INTRAMUSCULAR | Status: DC | PRN
  Administered 2021-05-11: 10 mL via PERINEURAL

## 2021-05-11 MED ORDER — EPHEDRINE 5 MG/ML INJ
10.0000 mg | Freq: Once | INTRAVENOUS | Status: AC
  Administered 2021-05-11: 10 mg via INTRAVENOUS

## 2021-05-11 MED ORDER — EPHEDRINE 5 MG/ML INJ: 5.0000 mg | Freq: Once | INTRAVENOUS | Status: DC

## 2021-05-11 MED ORDER — CEFAZOLIN SODIUM-DEXTROSE 2-4 GM/100ML-% IV SOLN
INTRAVENOUS | Status: AC
  Filled 2021-05-11: qty 100

## 2021-05-11 MED ORDER — FENTANYL CITRATE (PF) 100 MCG/2ML IJ SOLN
100.0000 ug | Freq: Once | INTRAMUSCULAR | Status: AC
  Administered 2021-05-11: 100 ug via INTRAVENOUS

## 2021-05-11 MED ORDER — DEXAMETHASONE SODIUM PHOSPHATE 10 MG/ML IJ SOLN
INTRAMUSCULAR | Status: AC
  Filled 2021-05-11: qty 1

## 2021-05-11 MED ORDER — ONDANSETRON HCL 4 MG/2ML IJ SOLN
INTRAMUSCULAR | Status: AC
  Filled 2021-05-11: qty 2

## 2021-05-11 MED ORDER — OXYCODONE HCL 5 MG PO TABS: 5.0000 mg | ORAL_TABLET | Freq: Once | ORAL | Status: DC | PRN

## 2021-05-11 MED ORDER — FENTANYL CITRATE (PF) 100 MCG/2ML IJ SOLN: 25.0000 ug | INTRAMUSCULAR | Status: DC | PRN

## 2021-05-11 MED ORDER — ACETAMINOPHEN 325 MG PO TABS: 325.0000 mg | ORAL_TABLET | ORAL | Status: DC | PRN

## 2021-05-11 MED ORDER — BUPIVACAINE LIPOSOME 1.3 % IJ SUSP
INTRAMUSCULAR | Status: DC | PRN
  Administered 2021-05-11: 10 mL via PERINEURAL

## 2021-05-11 MED ORDER — EPINEPHRINE 1 MG/10ML IJ SOSY
PREFILLED_SYRINGE | INTRAMUSCULAR | Status: AC
  Filled 2021-05-11: qty 10

## 2021-05-11 MED ORDER — EPHEDRINE 5 MG/ML INJ
INTRAVENOUS | Status: AC
  Filled 2021-05-11: qty 10

## 2021-05-11 MED ORDER — MIDAZOLAM HCL 2 MG/2ML IJ SOLN
INTRAMUSCULAR | Status: AC
  Filled 2021-05-11: qty 2

## 2021-05-11 MED ORDER — DEXMEDETOMIDINE (PRECEDEX) IN NS 20 MCG/5ML (4 MCG/ML) IV SYRINGE
PREFILLED_SYRINGE | INTRAVENOUS | Status: DC | PRN
  Administered 2021-05-11 (×2): 4 ug via INTRAVENOUS

## 2021-05-11 MED ORDER — ONDANSETRON HCL 4 MG/2ML IJ SOLN
INTRAMUSCULAR | Status: DC | PRN
  Administered 2021-05-11: 4 mg via INTRAVENOUS

## 2021-05-11 MED ORDER — LACTATED RINGERS IV SOLN: INTRAVENOUS | Status: DC

## 2021-05-11 MED ORDER — LACTATED RINGERS IV SOLN: INTRAVENOUS | Status: DC | PRN

## 2021-05-11 MED ORDER — OXYCODONE HCL 5 MG/5ML PO SOLN: 5.0000 mg | Freq: Once | ORAL | Status: DC | PRN

## 2021-05-11 MED ORDER — ACETAMINOPHEN 160 MG/5ML PO SOLN: 325.0000 mg | ORAL | Status: DC | PRN

## 2021-05-11 MED ORDER — EPHEDRINE SULFATE 50 MG/ML IJ SOLN
INTRAMUSCULAR | Status: DC | PRN
  Administered 2021-05-11 (×2): 5 mg via INTRAVENOUS

## 2021-05-11 MED ORDER — PROPOFOL 10 MG/ML IV BOLUS
INTRAVENOUS | Status: DC | PRN
  Administered 2021-05-11: 150 mg via INTRAVENOUS
  Administered 2021-05-11: 50 mg via INTRAVENOUS

## 2021-05-11 MED ORDER — FENTANYL CITRATE (PF) 100 MCG/2ML IJ SOLN
INTRAMUSCULAR | Status: AC
  Filled 2021-05-11: qty 2

## 2021-05-11 MED ORDER — GLYCOPYRROLATE 0.2 MG/ML IJ SOLN
INTRAMUSCULAR | Status: AC
  Filled 2021-05-11: qty 1

## 2021-05-11 MED ORDER — DEXMEDETOMIDINE (PRECEDEX) IN NS 20 MCG/5ML (4 MCG/ML) IV SYRINGE
PREFILLED_SYRINGE | INTRAVENOUS | Status: AC
  Filled 2021-05-11: qty 5

## 2021-05-11 MED ORDER — ATROPINE SULFATE 0.4 MG/ML IJ SOLN
INTRAMUSCULAR | Status: AC
  Filled 2021-05-11: qty 1

## 2021-05-11 MED ORDER — HYDROCODONE-ACETAMINOPHEN 7.5-325 MG/15ML PO SOLN: 15.0000 mL | Freq: Four times a day (QID) | ORAL | 0 refills | Status: DC | PRN

## 2021-05-11 MED ORDER — ONDANSETRON HCL 4 MG/2ML IJ SOLN: 4.0000 mg | Freq: Once | INTRAMUSCULAR | Status: DC | PRN

## 2021-05-11 MED ORDER — CEFAZOLIN SODIUM-DEXTROSE 2-4 GM/100ML-% IV SOLN: 2.0000 g | INTRAVENOUS | Status: DC

## 2021-05-11 MED ORDER — PHENYLEPHRINE HCL (PRESSORS) 10 MG/ML IV SOLN
INTRAVENOUS | Status: DC | PRN
  Administered 2021-05-11: 200 ug via INTRAVENOUS
  Administered 2021-05-11: 80 ug via INTRAVENOUS
  Administered 2021-05-11: 200 ug via INTRAVENOUS

## 2021-05-11 SURGICAL SUPPLY — 59 items
APL PRP STRL LF DISP 70% ISPRP (MISCELLANEOUS) ×1
BIT DRILL PASSING CMC 1/4 FLEX (BIT) ×1 IMPLANT
BLADE MINI RND TIP GREEN BEAV (BLADE) ×2 IMPLANT
BLADE SURG 15 STRL LF DISP TIS (BLADE) ×1 IMPLANT
BLADE SURG 15 STRL SS (BLADE) ×2
BNDG CMPR 9X4 STRL LF SNTH (GAUZE/BANDAGES/DRESSINGS) ×1
BNDG COHESIVE 3X5 TAN STRL LF (GAUZE/BANDAGES/DRESSINGS) ×2 IMPLANT
BNDG ESMARK 4X9 LF (GAUZE/BANDAGES/DRESSINGS) ×2 IMPLANT
BNDG GAUZE ELAST 4 BULKY (GAUZE/BANDAGES/DRESSINGS) ×2 IMPLANT
BUTTON ALL-SUT W/BACKSTOP (Orthopedic Implant) ×2 IMPLANT
CHLORAPREP W/TINT 26 (MISCELLANEOUS) ×2 IMPLANT
CORD BIPOLAR FORCEPS 12FT (ELECTRODE) ×2 IMPLANT
COVER BACK TABLE 60X90IN (DRAPES) ×2 IMPLANT
COVER MAYO STAND STRL (DRAPES) ×2 IMPLANT
COVER WAND RF STERILE (DRAPES) IMPLANT
CUFF TOURN SGL QUICK 18X4 (TOURNIQUET CUFF) ×2 IMPLANT
DECANTER SPIKE VIAL GLASS SM (MISCELLANEOUS) IMPLANT
DRAPE EXTREMITY T 121X128X90 (DISPOSABLE) ×2 IMPLANT
DRAPE OEC MINIVIEW 54X84 (DRAPES) ×2 IMPLANT
DRAPE SURG 17X23 STRL (DRAPES) ×2 IMPLANT
DRILL PASSING CMC 1/4 FLEX (BIT) ×2
GAUZE 4X4 16PLY RFD (DISPOSABLE) IMPLANT
GAUZE SPONGE 4X4 12PLY STRL (GAUZE/BANDAGES/DRESSINGS) ×2 IMPLANT
GAUZE XEROFORM 1X8 LF (GAUZE/BANDAGES/DRESSINGS) ×2 IMPLANT
GLOVE SRG 8 PF TXTR STRL LF DI (GLOVE) ×1 IMPLANT
GLOVE SURG ENC MOIS LTX SZ7.5 (GLOVE) ×2 IMPLANT
GLOVE SURG ORTHO LTX SZ8 (GLOVE) ×2 IMPLANT
GLOVE SURG POLYISO LF SZ6.5 (GLOVE) ×2 IMPLANT
GLOVE SURG UNDER POLY LF SZ7 (GLOVE) ×2 IMPLANT
GLOVE SURG UNDER POLY LF SZ8 (GLOVE) ×2
GLOVE SURG UNDER POLY LF SZ8.5 (GLOVE) ×2 IMPLANT
GOWN STRL REUS W/ TWL LRG LVL3 (GOWN DISPOSABLE) ×2 IMPLANT
GOWN STRL REUS W/TWL LRG LVL3 (GOWN DISPOSABLE) ×4
GOWN STRL REUS W/TWL XL LVL3 (GOWN DISPOSABLE) ×4 IMPLANT
NEEDLE PRECISIONGLIDE 27X1.5 (NEEDLE) IMPLANT
NS IRRIG 1000ML POUR BTL (IV SOLUTION) ×2 IMPLANT
PACK BASIN DAY SURGERY FS (CUSTOM PROCEDURE TRAY) ×2 IMPLANT
PAD CAST 3X4 CTTN HI CHSV (CAST SUPPLIES) ×1 IMPLANT
PADDING CAST ABS 3INX4YD NS (CAST SUPPLIES)
PADDING CAST ABS COTTON 3X4 (CAST SUPPLIES) IMPLANT
PADDING CAST COTTON 3X4 STRL (CAST SUPPLIES) ×2
SLEEVE SCD COMPRESS KNEE MED (STOCKING) ×2 IMPLANT
SLING ARM FOAM STRAP LRG (SOFTGOODS) ×2 IMPLANT
SPLINT PLASTER CAST XFAST 3X15 (CAST SUPPLIES) ×10 IMPLANT
SPLINT PLASTER XTRA FASTSET 3X (CAST SUPPLIES) ×10
STOCKINETTE 4X48 STRL (DRAPES) ×2 IMPLANT
SUT ETHIBOND 3-0 V-5 (SUTURE) ×2 IMPLANT
SUT ETHILON 4 0 PS 2 18 (SUTURE) ×2 IMPLANT
SUT FIBERWIRE 4-0 18 DIAM BLUE (SUTURE)
SUT MERSILENE 4 0 P 3 (SUTURE) IMPLANT
SUT STEEL 3 0 (SUTURE) IMPLANT
SUT VIC AB 4-0 P-3 18XBRD (SUTURE) IMPLANT
SUT VIC AB 4-0 P2 18 (SUTURE) IMPLANT
SUT VIC AB 4-0 P3 18 (SUTURE)
SUTURE FIBERWR 4-0 18 DIA BLUE (SUTURE) IMPLANT
SYR BULB EAR ULCER 3OZ GRN STR (SYRINGE) ×2 IMPLANT
SYR CONTROL 10ML LL (SYRINGE) IMPLANT
TOWEL GREEN STERILE FF (TOWEL DISPOSABLE) ×4 IMPLANT
UNDERPAD 30X36 HEAVY ABSORB (UNDERPADS AND DIAPERS) ×2 IMPLANT

## 2021-05-11 NOTE — Anesthesia Preprocedure Evaluation (Addendum)
Anesthesia Evaluation  Patient identified by MRN, date of birth, ID band Patient awake    Reviewed: Allergy & Precautions, H&P , NPO status , Patient's Chart, lab work & pertinent test results, reviewed documented beta blocker date and time   Airway Mallampati: II  TM Distance: >3 FB Neck ROM: Limited    Dental no notable dental hx. (+) Teeth Intact, Dental Advisory Given   Pulmonary neg pulmonary ROS,    Pulmonary exam normal breath sounds clear to auscultation       Cardiovascular Exercise Tolerance: Good hypertension, Pt. on medications  Rhythm:regular Rate:Normal     Neuro/Psych PSYCHIATRIC DISORDERS Anxiety negative neurological ROS     GI/Hepatic negative GI ROS, Neg liver ROS,   Endo/Other  diabetes  Renal/GU negative Renal ROS  negative genitourinary   Musculoskeletal   Abdominal   Peds  Hematology negative hematology ROS (+)   Anesthesia Other Findings Cervicalgia Cervical disc disorder with radiculopathy Chronic pain syndrome PTSD (post-traumatic stress disorder)    Reproductive/Obstetrics negative OB ROS                            Anesthesia Physical Anesthesia Plan  ASA: III  Anesthesia Plan: General   Post-op Pain Management: GA combined w/ Regional for post-op pain   Induction:   PONV Risk Score and Plan: 2  Airway Management Planned: LMA  Additional Equipment: None  Intra-op Plan:   Post-operative Plan: Extubation in OR  Informed Consent: I have reviewed the patients History and Physical, chart, labs and discussed the procedure including the risks, benefits and alternatives for the proposed anesthesia with the patient or authorized representative who has indicated his/her understanding and acceptance.     Dental Advisory Given  Plan Discussed with: CRNA and Anesthesiologist  Anesthesia Plan Comments:         Anesthesia Quick Evaluation

## 2021-05-11 NOTE — Op Note (Addendum)
NAME: Tony [name] MEDICAL RECORD NO: [id number] DATE OF BIRTH: 1951/01/20 FACILITY: [facility] LOCATION: [facility] PHYSICIAN: Nicki Reaper, MD   OPERATIVE REPORT   DATE OF PROCEDURE: 05/11/21    PREOPERATIVE DIAGNOSIS:   CMC arthritis left thumb   POSTOPERATIVE DIAGNOSIS:   Same   PROCEDURE:   Excision trapezium with FCR tendon transfer and microlink suture left hand   SURGEON: Cindee Salt, M.D.   ASSISTANT: Betha Loa, MD   ANESTHESIA:  Regional with sedation   INTRAVENOUS FLUIDS:  Per anesthesia flow sheet.   ESTIMATED BLOOD LOSS:  Minimal.   COMPLICATIONS:  None.   SPECIMENS:  none   TOURNIQUET TIME:    Total Tourniquet Time Documented: Upper Arm (Left) - 55 minutes Total: Upper Arm (Left) - 55 minutes    DISPOSITION:  Stable to PACU.   INDICATIONS: Patient is a 70 year old male who has had basilar thumb pain not responsive to conservative treatment is elected undergo arthroplasty.  Pre-.  Postoperative course been discussed along with risks and complications.  He is aware that there is no guarantee to the surgery the possibility of infection recurrence injury to arteries nerves tendons incomplete relief symptoms dystrophy.  In the preoperative area the patient is seen the extremity marked by both patient and surgeon supraclavicular block carried out without difficulty under the direction of the anesthesia department.  OPERATIVE COURSE: The patient is brought to the operating room placed in the supine position with left arm free.  Anesthesia was completed under the anesthesia department control.  A timeout was taken to confirm patient procedure.  Prep was done with ChloraPrep 3-minute dry time allowed timeout confirmed.  The limb was exsanguinated with an Esmarch bandage turn placed high in the arm was inflated to 250 mmHg.  A longitudinal incision was made over the insertion of the extensor pollicis brevis and abductor pollicis longus carried down  through subcutaneous tissue.  Bleeders were electrocauterized with bipolar.  Branches of the radial nerve were not seen in the despite being looked for.  The dissection was carried between the EPB and the APL tendon.  The [facility] joint was identified distally.  The radial artery was identified proximally.  Retractors were placed retracting retracting protecting the radial artery and veins.  The incision was made through the periosteum of the trapezium.  This was elevated around the entire bone.  The trapezial trapezoid joint was identified.  The trapezium had no articular cartilage left nor did the proximal aspect of the metacarpal.  The trapezium was then removed piecemeal using a rondure.  The flexor carpi radialis tendon was immediately apparent in the base of the incision.  Retractors were placed retracting this and distally and a one half of the tendon was then harvested leaving it attached distally to the base of the index metacarpal.  An incision was made just distal to the insertion of the abductor pollicis longus tendon.  The microlink guidepin was then inserted in the base of the metacarpal passed through the base of the index metacarpal.  This was identified and on image intensification to lie in good position although slightly deep.  An incision was then made over the egress of the guidepin in the intermetacarpal space between the index and middle fingers.  The dissection carried down to the guidepin which was then brought through the intermetacarpal space allowing the microlink suture to be passed through the base of the thumb and index metacarpal.  With stressing of the thumb it did  not translocate proximally.  The microlink suture was then sutured in place placing each anchor on the respective metacarpal base.  The wounds were irrigated and the dorsal incision closed interrupted 4-0 nylon sutures.  The one half of the flexor carpi radialis tendon was then passed through the insertion of the abductor  pollicis longus and sutured with 3-0 Ethibond sutures it was then brought back in to the wound and sutured to the insertion at the base of the index metacarpal with the Ethibond and then sutured to itself midportion through the transfer using the Ethibond suture.  The capsule was closed interrupted 4-0 Vicryl sutures.  The subcutaneous tissue was closed with interrupted 4-0 Vicryl and the skin with interrupted 4-0 nylon sutures.  X-rays AP lateral with stress revealed no proximal migration of the thumb metacarpal.  A sterile compressive dressing dorsal palmar thumb spica splint was applied.  Deflation of the tourniquet all fingers immediately pink.  He was taken to the recovery room for observation in satisfactory condition.  He will be discharged home to return to the hand center [facility] in 1 week on [place] 7 04/18/2024's at his request.  He will add Tylenol and ibuprofen as necessary with this.   Cindee Salt, MD Electronically signed, 05/11/21

## 2021-05-11 NOTE — Anesthesia Postprocedure Evaluation (Signed)
Anesthesia Post Note  Patient: Dariel Betzer  Procedure(s) Performed: LEFT HAND TRAPEZIUM EXCISION WITH SUSPENSIONPLASTY WITH MICROLINK (Left Wrist)     Patient location during evaluation: PACU Anesthesia Type: General Level of consciousness: awake and alert Pain management: pain level controlled Vital Signs Assessment: post-procedure vital signs reviewed and stable Respiratory status: spontaneous breathing, nonlabored ventilation, respiratory function stable and patient connected to nasal cannula oxygen Cardiovascular status: blood pressure returned to baseline and stable Postop Assessment: no apparent nausea or vomiting Anesthetic complications: no   No complications documented.  Last Vitals:  Vitals:   05/11/21 1200 05/11/21 1215  BP: (!) 143/82 128/84  Pulse: 84 80  Resp: 14 14  Temp:    SpO2: 98% 97%    Last Pain:  Vitals:   05/11/21 1200  TempSrc:   PainSc: 0-No pain                 Eldar Robitaille
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
## 2021-05-11 NOTE — Op Note (Signed)
I assisted Surgeon(s) and Role:    * Cindee Salt, MD - Primary    * Betha Loa, MD - Assisting on the Procedure(s): LEFT HAND TRAPEZIUM EXCISION WITH SUSPENSIONPLASTY WITH MICROLINK on 05/11/2021.  I provided assistance on this case as follows: retraction soft tissues, harvesting tendon transfer, passage and tying of suture anchor.  Electronically signed by: Betha Loa, MD Date: 05/11/2021 Time: 11:41 AM

## 2021-05-11 NOTE — Anesthesia Procedure Notes (Addendum)
Procedure Name: LMA Insertion Performed by: Karen Kitchens, CRNA Pre-anesthesia Checklist: Patient identified, Emergency Drugs available, Suction available and Patient being monitored Patient Re-evaluated:Patient Re-evaluated prior to induction Oxygen Delivery Method: Circle system utilized Preoxygenation: Pre-oxygenation with 100% oxygen Induction Type: IV induction Ventilation: Mask ventilation without difficulty LMA: LMA inserted LMA Size: 5.0 Number of attempts: 1 Placement Confirmation: positive ETCO2,  CO2 detector and breath sounds checked- equal and bilateral Tube secured with: Tape Dental Injury: Teeth and Oropharynx as per pre-operative assessment

## 2021-05-11 NOTE — Anesthesia Procedure Notes (Signed)
Anesthesia Regional Block: Axillary brachial plexus block   Pre-Anesthetic Checklist: ,, timeout performed, Correct Patient, Correct Site, Correct Laterality, Correct Procedure, Correct Position, site marked, Risks and benefits discussed,  Surgical consent,  Pre-op evaluation,  At surgeon's request and post-op pain management  Laterality: Left  Prep: chloraprep       Needles:  Injection technique: Single-shot  Needle Type: Echogenic Stimulator Needle     Needle Length: 5cm  Needle Gauge: 22     Additional Needles:   Procedures:, nerve stimulator,,, ultrasound used (permanent image in chart),,,,   Nerve Stimulator or Paresthesia:  Response: hand, 0.45 mA,   Additional Responses:   Narrative:  Start time: 05/11/2021 9:20 AM End time: 05/11/2021 9:25 AM Injection made incrementally with aspirations every 5 mL.  Performed by: Personally  Anesthesiologist: Bethena Midget, MD  Additional Notes: Functioning IV was confirmed and monitors were applied.  A 37mm 22ga Arrow echogenic stimulator needle was used. Sterile prep and drape,hand hygiene and sterile gloves were used. Ultrasound guidance: relevant anatomy identified, needle position confirmed, local anesthetic spread visualized around nerve(s)., vascular puncture avoided.  Image printed for medical record. Negative aspiration and negative test dose prior to incremental administration of local anesthetic. The patient tolerated the procedure well.

## 2021-05-11 NOTE — Transfer of Care (Signed)
Immediate Anesthesia Transfer of Care Note  Patient: Tony [name]  Procedure(s) Performed: LEFT HAND TRAPEZIUM EXCISION WITH SUSPENSIONPLASTY WITH MICROLINK (Left Wrist)  Patient Location: PACU  Anesthesia Type:General and Regional  Level of Consciousness: awake, alert  and oriented  Airway & Oxygen Therapy: Patient Spontanous Breathing and Patient connected to face mask oxygen  Post-op Assessment: Report given to RN and Post -op Vital signs reviewed and stable  Post vital signs: Reviewed and stable  Last Vitals:  Vitals Value Taken Time  BP 137/85 05/11/21 1145  Temp    Pulse 96 05/11/21 1146  Resp 18 05/11/21 1146  SpO2 97 % 05/11/21 1146  Vitals shown include unvalidated device data.  Last Pain:  Vitals:   05/11/21 0827  TempSrc: Oral  PainSc: 10-Worst pain ever         Complications: No complications documented.

## 2021-05-11 NOTE — Brief Op Note (Signed)
05/11/2021  11:37 AM  PATIENT:  Tony [name]  70 y.o. male  PRE-OPERATIVE DIAGNOSIS:  LEFT THUMB END STAGE OSTEOARTHRITIS CARPOMETACARPAL JOINT  POST-OPERATIVE DIAGNOSIS:  LEFT THUMB END STAGE OSTEOARTHRITIS   PROCEDURE:  Procedure(s) with comments: LEFT HAND TRAPEZIUM EXCISION WITH SUSPENSIONPLASTY WITH MICROLINK (Left) - AXILLARY BLOCK  SURGEON:  Surgeon(s) and Role:    * Cindee Salt, MD - Primary    * Betha Loa, MD - Assisting  PHYSICIAN ASSISTANT:   ASSISTANTS: K Lyndi Holbein,MD   ANESTHESIA:   regional and IV sedation  EBL: 17ml BLOOD ADMINISTERED:none  DRAINS: none   LOCAL MEDICATIONS USED:  NONE  SPECIMEN:  No Specimen  DISPOSITION OF SPECIMEN:  N/A  COUNTS:  YES  TOURNIQUET:   Total Tourniquet Time Documented: Upper Arm (Left) - 55 minutes Total: Upper Arm (Left) - 55 minutes   DICTATION: .Reubin Milan Dictation  PLAN OF CARE: Discharge to home after PACU  PATIENT DISPOSITION:  PACU - hemodynamically stable.

## 2021-05-11 NOTE — Discharge Instructions (Addendum)
Post Anesthesia Home Care Instructions  Activity: Get plenty of rest for the remainder of the day. A responsible individual must stay with you for 24 hours following the procedure.  For the next 24 hours, DO NOT: -Drive a car -Paediatric nurse -Drink alcoholic beverages -Take any medication unless instructed by your physician -Make any legal decisions or sign important papers.  Meals: Start with liquid foods such as gelatin or soup. Progress to regular foods as tolerated. Avoid greasy, spicy, heavy foods. If nausea and/or vomiting occur, drink only clear liquids until the nausea and/or vomiting subsides. Call your physician if vomiting continues.  Special Instructions/Symptoms: Your throat may feel dry or sore from the anesthesia or the breathing tube placed in your throat during surgery. If this causes discomfort, gargle with warm salt water. The discomfort should disappear within 24 hours.  If you had a scopolamine patch placed behind your ear for the management of post- operative nausea and/or vomiting:  1. The medication in the patch is effective for 72 hours, after which it should be removed.  Wrap patch in a tissue and discard in the trash. Wash hands thoroughly with soap and water. 2. You may remove the patch earlier than 72 hours if you experience unpleasant side effects which may include dry mouth, dizziness or visual disturbances. 3. Avoid touching the patch. Wash your hands with soap and water after contact with the patch.       Regional Anesthesia Blocks  1. Numbness or the inability to move the "blocked" extremity may last from 3-48 hours after placement. The length of time depends on the medication injected and your individual response to the medication. If the numbness is not going away after 48 hours, call your surgeon.  2. The extremity that is blocked will need to be protected until the numbness is gone and the  Strength has returned. Because you cannot feel it, you  will need to take extra care to avoid injury. Because it may be weak, you may have difficulty moving it or using it. You may not know what position it is in without looking at it while the block is in effect.  3. For blocks in the legs and feet, returning to weight bearing and walking needs to be done carefully. You will need to wait until the numbness is entirely gone and the strength has returned. You should be able to move your leg and foot normally before you try and bear weight or walk. You will need someone to be with you when you first try to ensure you do not fall and possibly risk injury.  4. Bruising and tenderness at the needle site are common side effects and will resolve in a few days.  5. Persistent numbness or new problems with movement should be communicated to the surgeon or the [facility] [phone] [place] [phone]).    [facility] Instructions Hand Surgery  Wound Care: Keep your hand elevated above the level of your heart.  Do not allow it to dangle by your side.  Keep the dressing dry and do not remove it unless your doctor advises you to do so.  He will usually change it at the time of your post-op visit.  Moving your fingers is advised to stimulate circulation but will depend on the site of your surgery.  If you have a splint applied, your doctor will advise you regarding movement.  Activity: Do not drive or operate machinery today.  Rest today  and then you may return to your normal activity and work as indicated by your physician.  Diet:  Drink liquids today or eat a light diet.  You may resume a regular diet tomorrow.    General expectations: Pain for two to three days. Fingers may become slightly swollen.  Call your doctor if any of the following occur: Severe pain not relieved by pain medication. Elevated temperature. Dressing soaked with blood. Inability to move fingers. White or bluish color to fingers. Information  for Discharge Teaching: EXPAREL (bupivacaine liposome injectable suspension)   Your surgeon or anesthesiologist gave you EXPAREL(bupivacaine) to help control your pain after surgery.   EXPAREL is a local anesthetic that provides pain relief by numbing the tissue around the surgical site.  EXPAREL is designed to release pain medication over time and can control pain for up to 72 hours.  Depending on how you respond to EXPAREL, you may require less pain medication during your recovery.  Possible side effects:  Temporary loss of sensation or ability to move in the area where bupivacaine was injected.  Nausea, vomiting, constipation  Rarely, numbness and tingling in your mouth or lips, lightheadedness, or anxiety may occur.  Call your doctor right away if you think you may be experiencing any of these sensations, or if you have other questions regarding possible side effects.  Follow all other discharge instructions given to you by your surgeon or nurse. Eat a healthy diet and drink plenty of water or other fluids.  If you return to the hospital for any reason within 96 hours following the administration of EXPAREL, it is important for health care providers to know that you have received this anesthetic. A teal colored band has been placed on your arm with the date, time and amount of EXPAREL you have received in order to alert and inform your health care providers. Please leave this armband in place for the full 96 hours following administration, and then you may remove the band.

## 2021-05-11 NOTE — Progress Notes (Signed)
Assisted Dr. Oddono with left, ultrasound guided, axillary block. Side rails up, monitors on throughout procedure. See vital signs in flow sheet. Tolerated Procedure well. 

## 2021-05-12 ENCOUNTER — Encounter (HOSPITAL_BASED_OUTPATIENT_CLINIC_OR_DEPARTMENT_OTHER): Payer: Self-pay | Admitting: Orthopedic Surgery

## 2021-05-16 ENCOUNTER — Other Ambulatory Visit (HOSPITAL_COMMUNITY): Payer: Self-pay | Admitting: Psychiatry

## 2021-07-17 ENCOUNTER — Ambulatory Visit (INDEPENDENT_AMBULATORY_CARE_PROVIDER_SITE_OTHER): Payer: Medicare Other | Admitting: Family Medicine

## 2021-07-17 ENCOUNTER — Encounter: Payer: Self-pay | Admitting: Family Medicine

## 2021-07-17 ENCOUNTER — Other Ambulatory Visit: Payer: Self-pay

## 2021-07-17 ENCOUNTER — Telehealth: Payer: Self-pay | Admitting: Family Medicine

## 2021-07-17 DIAGNOSIS — M545 Low back pain, unspecified: Secondary | ICD-10-CM

## 2021-07-17 MED ORDER — HYDROCODONE-ACETAMINOPHEN 5-325 MG PO TABS: 1.0000 | ORAL_TABLET | ORAL | 0 refills | Status: DC | PRN
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
## 2021-07-17 NOTE — Telephone Encounter (Signed)
Left message on the home voice mail that the medication was sent in to the pharmacy.

## 2021-07-17 NOTE — Telephone Encounter (Signed)
Please advise 

## 2021-07-17 NOTE — Telephone Encounter (Signed)
Pt just left his appt with Dr. Prince Rome but is calling asking that the prescription he talked about in his appt be sent to [facility] pharmacy on [address]. The best call back number is [phone].

## 2021-07-20 ENCOUNTER — Encounter: Payer: Self-pay | Admitting: Physical Medicine and Rehabilitation

## 2021-07-20 ENCOUNTER — Ambulatory Visit: Payer: Self-pay

## 2021-07-20 ENCOUNTER — Ambulatory Visit (INDEPENDENT_AMBULATORY_CARE_PROVIDER_SITE_OTHER): Payer: Medicare Other | Admitting: Physical Medicine and Rehabilitation

## 2021-07-20 ENCOUNTER — Other Ambulatory Visit: Payer: Self-pay

## 2021-07-20 VITALS — BP 168/88 | HR 61

## 2021-07-20 DIAGNOSIS — F4310 Post-traumatic stress disorder, unspecified: Secondary | ICD-10-CM | POA: Diagnosis not present

## 2021-07-20 DIAGNOSIS — G894 Chronic pain syndrome: Secondary | ICD-10-CM

## 2021-07-20 DIAGNOSIS — M47816 Spondylosis without myelopathy or radiculopathy, lumbar region: Secondary | ICD-10-CM | POA: Diagnosis not present

## 2021-07-20 DIAGNOSIS — M545 Low back pain, unspecified: Secondary | ICD-10-CM | POA: Diagnosis not present

## 2021-07-20 DIAGNOSIS — M7918 Myalgia, other site: Secondary | ICD-10-CM | POA: Diagnosis not present

## 2021-07-20 DIAGNOSIS — M4802 Spinal stenosis, cervical region: Secondary | ICD-10-CM

## 2021-07-20 DIAGNOSIS — M5412 Radiculopathy, cervical region: Secondary | ICD-10-CM | POA: Diagnosis not present

## 2021-07-20 DIAGNOSIS — G8929 Other chronic pain: Secondary | ICD-10-CM

## 2021-07-20 MED ORDER — BETAMETHASONE SOD PHOS & ACET 6 (3-3) MG/ML IJ SUSP
12.0000 mg | Freq: Once | INTRAMUSCULAR | Status: AC
  Administered 2021-07-20: 12 mg

## 2021-07-20 NOTE — Patient Instructions (Signed)

## 2021-07-20 NOTE — Progress Notes (Signed)
Pt state neck pain that travels to both shoulders. Pt state turning his head and bending over makes the pain worse. Pt state he has hard time sleeping st night. Pt state he take pain meds to help ease his pain. Pt has hx of inj on 04/20/21 pt state it helped for three months.   Numeric Pain Rating Scale and Functional Assessment Average Pain 10   In the last MONTH (on 0-10 scale) has pain interfered with the following?  1. General activity like being  able to carry out your everyday physical activities such as walking, climbing stairs, carrying groceries, or moving a chair?  Rating(10)   +Driver, -BT, -Dye Allergies.
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
## 2021-07-21 ENCOUNTER — Telehealth: Payer: Self-pay | Admitting: Physical Medicine and Rehabilitation

## 2021-07-21 NOTE — Telephone Encounter (Signed)
Pt needs therapy referral sent to [facility]  location

## 2021-07-24 ENCOUNTER — Telehealth: Payer: Self-pay

## 2021-07-24 NOTE — Telephone Encounter (Signed)
[facility] Called would like PT Script faxed over to them. Patient states Dr Alvester Morin referred him to them.   CB # [phone]  FAX # [phone]

## 2021-07-24 NOTE — Telephone Encounter (Signed)
Ok for referral?

## 2021-07-25 NOTE — Telephone Encounter (Signed)
Left message asking patient to call us back to confirm PT location- [facility] vs [facility]. 

## 2021-07-25 NOTE — Telephone Encounter (Signed)
Left message asking patient to call us back to confirm PT location- [facility] vs [facility].

## 2021-08-03 ENCOUNTER — Encounter: Payer: Medicare Other | Attending: Psychology | Admitting: Psychology

## 2021-08-03 ENCOUNTER — Other Ambulatory Visit: Payer: Self-pay

## 2021-08-03 DIAGNOSIS — G894 Chronic pain syndrome: Secondary | ICD-10-CM

## 2021-08-03 DIAGNOSIS — M5412 Radiculopathy, cervical region: Secondary | ICD-10-CM | POA: Insufficient documentation

## 2021-08-03 DIAGNOSIS — F4310 Post-traumatic stress disorder, unspecified: Secondary | ICD-10-CM | POA: Insufficient documentation

## 2021-08-07 ENCOUNTER — Telehealth (INDEPENDENT_AMBULATORY_CARE_PROVIDER_SITE_OTHER): Payer: Medicare Other | Admitting: Psychiatry

## 2021-08-07 ENCOUNTER — Encounter: Payer: Self-pay | Admitting: Physical Medicine and Rehabilitation

## 2021-08-07 ENCOUNTER — Encounter (HOSPITAL_COMMUNITY): Payer: Self-pay | Admitting: Psychiatry

## 2021-08-07 ENCOUNTER — Other Ambulatory Visit: Payer: Self-pay

## 2021-08-07 DIAGNOSIS — F4310 Post-traumatic stress disorder, unspecified: Secondary | ICD-10-CM | POA: Diagnosis not present

## 2021-08-07 DIAGNOSIS — F410 Panic disorder [episodic paroxysmal anxiety] without agoraphobia: Secondary | ICD-10-CM

## 2021-08-07 MED ORDER — ESCITALOPRAM OXALATE 10 MG PO TABS: 10.0000 mg | ORAL_TABLET | Freq: Every day | ORAL | 0 refills | Status: DC
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
## 2021-08-07 NOTE — Procedures (Signed)
Cervical Epidural Steroid Injection - Interlaminar Approach with Fluoroscopic Guidance  Patient: Tony [name]      Date of Birth: 01-29-1951 MRN: [id number] PCP: Pcp, No      Visit Date: 07/20/2021   Universal Protocol:    Date/Time: 08/22/226:28 AM  Consent Given By: the patient  Position: PRONE  Additional Comments: Vital signs were monitored before and after the procedure. Patient was prepped and draped in the usual sterile fashion. The correct patient, procedure, and site was verified.   Injection Procedure Details:   Procedure diagnoses: Cervical radiculopathy [M54.12]    Meds Administered:  Meds ordered this encounter  Medications   betamethasone acetate-betamethasone sodium phosphate (CELESTONE) injection 12 mg     Laterality: Left  Location/Site: C7-T1  Needle: 3.5 in., 20 ga. Tuohy  Needle Placement: Paramedian epidural space  Findings:  -Comments: Excellent flow of contrast into the epidural space.  Procedure Details: Using a paramedian approach from the side mentioned above, the region overlying the inferior lamina was localized under fluoroscopic visualization and the soft tissues overlying this structure were infiltrated with 4 ml. of 1% Lidocaine without Epinephrine. A # 20 gauge, Tuohy needle was inserted into the epidural space using a paramedian approach.  The epidural space was localized using loss of resistance along with contralateral oblique bi-planar fluoroscopic views.  After negative aspirate for air, blood, and CSF, a 2 ml. volume of Isovue-250 was injected into the epidural space and the flow of contrast was observed. Radiographs were obtained for documentation purposes.   The injectate was administered into the level noted above.  Additional Comments:  The patient tolerated the procedure well Dressing: 2 x 2 sterile gauze and Band-Aid    Post-procedure details: Patient was observed during the procedure. Post-procedure instructions were  reviewed.  Patient left the clinic in stable condition.

## 2021-08-14 ENCOUNTER — Other Ambulatory Visit (HOSPITAL_COMMUNITY): Payer: Self-pay | Admitting: Psychiatry

## 2021-08-15 ENCOUNTER — Encounter (HOSPITAL_COMMUNITY): Payer: Self-pay | Admitting: Emergency Medicine

## 2021-08-15 ENCOUNTER — Emergency Department (HOSPITAL_COMMUNITY)
Admission: EM | Admit: 2021-08-15 | Discharge: 2021-08-16 | Disposition: A | Discharge status: Home / Self Care | Payer: Medicare Other | Attending: Emergency Medicine | Admitting: Emergency Medicine

## 2021-08-15 ENCOUNTER — Other Ambulatory Visit: Payer: Self-pay

## 2021-08-15 DIAGNOSIS — X58XXXA Exposure to other specified factors, initial encounter: Secondary | ICD-10-CM | POA: Insufficient documentation

## 2021-08-15 DIAGNOSIS — Z7982 Long term (current) use of aspirin: Secondary | ICD-10-CM | POA: Diagnosis not present

## 2021-08-15 DIAGNOSIS — Z79899 Other long term (current) drug therapy: Secondary | ICD-10-CM | POA: Diagnosis not present

## 2021-08-15 DIAGNOSIS — E119 Type 2 diabetes mellitus without complications: Secondary | ICD-10-CM | POA: Insufficient documentation

## 2021-08-15 DIAGNOSIS — Z794 Long term (current) use of insulin: Secondary | ICD-10-CM | POA: Insufficient documentation

## 2021-08-15 DIAGNOSIS — S60322A Blister (nonthermal) of left thumb, initial encounter: Secondary | ICD-10-CM | POA: Diagnosis not present

## 2021-08-15 DIAGNOSIS — I10 Essential (primary) hypertension: Secondary | ICD-10-CM | POA: Diagnosis not present

## 2021-08-15 DIAGNOSIS — T148XXA Other injury of unspecified body region, initial encounter: Secondary | ICD-10-CM

## 2021-08-15 DIAGNOSIS — Z7984 Long term (current) use of oral hypoglycemic drugs: Secondary | ICD-10-CM | POA: Insufficient documentation

## 2021-08-15 NOTE — ED Provider Notes (Signed)
Emergency Medicine Provider Triage Evaluation Note  Tony [name] , a 71 y.o. male  was evaluated in triage.  Pt complains of left thumb pain.  Review of Systems  Positive: Left thumb pain Negative: fever  Physical Exam  There were no vitals taken for this visit. Gen:   Awake, no distress   Resp:  Normal effort  MSK:   Moves extremities without difficulty  Other:  Blister noted to the left hand  Medical Decision Making  Medically screening exam initiated at 6:11 PM.  Appropriate orders placed.  Tony [name] was informed that the remainder of the evaluation will be completed by another provider, this initial triage assessment does not replace that evaluation, and the importance of remaining in the ED until their evaluation is complete.     Karrie Meres, PA-C 08/15/21 1812    Melene Plan, DO 08/15/21 2248

## 2021-08-15 NOTE — ED Triage Notes (Addendum)
Pt presents with blister to left thumb. States he was sent by physical therapy for it to be drained and dressed.

## 2021-08-16 NOTE — ED Provider Notes (Signed)
[facility] EMERGENCY DEPARTMENT Provider Note  CSN: [id number] Arrival date & time: 08/15/21 1726  Chief Complaint(s) Blister  HPI Tony [name] is a 70 y.o. male here for blister on left thumb. Currently in PT after LUE surgery in May. Reports that they are using heat, which was left on longer than usual and may have been hotter as well. Developed blister afterward. No redness. No pain.  The history is provided by the patient.   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 Authorizing Provider  amLODipine (NORVASC) 10 MG tablet Take 10 mg by mouth daily.    [provider]  aspirin EC 81 MG tablet Take 81 mg by mouth daily. Swallow whole.    [provider]  empagliflozin (JARDIANCE) 10 MG TABS tablet Take 10 mg by mouth daily.    [provider]  escitalopram (LEXAPRO) 10 MG tablet TAKE 1 TABLET DAILY 08/15/21   Thresa Ross, MD  HYDROcodone-acetaminophen (HYCET) 7.5-325 mg/15 ml solution Take 15 mLs by mouth 4 (four) times daily as needed for moderate pain. 05/11/21 05/11/22  Cindee Salt, MD  HYDROcodone-acetaminophen (NORCO/VICODIN) 5-325 MG tablet Take 1 tablet by mouth every 4 (four) hours as needed for moderate pain. 07/17/21   Hilts, Casimiro Needle, MD  insulin glargine (LANTUS) 100 UNIT/ML injection Inject into the skin 2 (two) times daily.    [provider]  lidocaine (LIDODERM) 5 % Place 1 patch onto the skin daily. Remove & Discard patch within 12 hours or as directed by  MD 12/29/20   Hilts, Casimiro Needle, MD  metFORMIN (GLUCOPHAGE) 1000 MG tablet Take 1,000 mg by mouth 2 (two) times daily with a meal.    [provider]  oxiconazole (OXISTAT) 1 % CREA Apply topically 2 (two) times daily.    [provider]  pioglitazone (ACTOS) 45 MG tablet Take 45 mg by mouth daily.    [provider]  pregabalin (LYRICA) 75 MG capsule Take 75 mg by mouth 2 (two) times daily.    [provider]  rosuvastatin (CRESTOR) 10 MG tablet Take 10 mg by mouth daily.    [provider]  sitaGLIPtin (JANUVIA) 100 MG tablet Take 100 mg by mouth daily.    [provider]  tadalafil (CIALIS) 20 MG tablet Take 20 mg by mouth daily as needed. 09/26/19   [provider]  Past Surgical History Past Surgical History:  Procedure Laterality Date   CARPOMETACARPEL SUSPENSION PLASTY Left 05/11/2021   Procedure: LEFT HAND TRAPEZIUM EXCISION WITH SUSPENSIONPLASTY WITH MICROLINK;  Surgeon: Cindee Salt, MD;  Location: [facility];  Service: Orthopedics;  Laterality: Left;  AXILLARY BLOCK   COLONOSCOPY     EYE SURGERY     Family History No family history on file.  Social History Social History   Tobacco Use   Smoking status: Never   Smokeless tobacco: Never  Substance Use Topics   Alcohol use: Not Currently   Drug use: Never   Allergies Patient has no known allergies.  Review of Systems Review of Systems All other systems are reviewed and are negative for acute change except as noted in the HPI  Physical Exam Vital Signs  I have reviewed the triage vital signs BP (!) 149/89 (BP Location: Right Arm)   Pulse (!) 58   Temp 98.7 F (37.1 C) (Oral)   Resp 18   SpO2 96%   Physical Exam Vitals reviewed.  Constitutional:      General: He is not in acute distress.    Appearance: He is  well-developed. He is not diaphoretic.  HENT:     Head: Normocephalic and atraumatic.     Right Ear: External ear normal.     Left Ear: External ear normal.     Nose: Nose normal.     Mouth/Throat:     Mouth: Mucous membranes are moist.  Eyes:     General: No scleral icterus.    Conjunctiva/sclera: Conjunctivae normal.  Neck:     Trachea: Phonation normal.  Cardiovascular:     Rate and Rhythm: Normal rate and regular rhythm.  Pulmonary:     Effort: Pulmonary effort is normal. No respiratory distress.     Breath sounds: No stridor.  Abdominal:     General: There is no distension.  Musculoskeletal:        General: Normal range of motion.       Hands:     Cervical back: Normal range of motion.     Comments: Approx 1 cm fluid filled blister with clear. No surrounding erythema.  Neurological:     Mental Status: He is alert and oriented to person, place, and time.  Psychiatric:        Behavior: Behavior normal.    ED Results and Treatments Labs (all labs ordered are listed, but only abnormal results are displayed) Labs Reviewed - No data to display                                                                                                                       EKG  EKG Interpretation  Date/Time:    Ventricular Rate:    PR Interval:    QRS Duration:   QT Interval:    QTC Calculation:   R Axis:     Text Interpretation:         Radiology No results  found.  Pertinent labs & imaging results that were available during my care of the patient were reviewed by me and considered in my medical decision making (see MDM for details).  Medications Ordered in ED Medications - No data to display                                                                                                                                   Procedures Procedures  (including critical care time)  Medical Decision Making / ED Course I have reviewed the nursing notes for this encounter and  the patient's prior records (if available in EHR or on provided paperwork).  Tony [name] was evaluated in Emergency Department on 08/16/2021 for the symptoms described in the history of present illness. He was evaluated in the context of the global COVID-19 pandemic, which necessitated consideration that the patient might be at risk for infection with the SARS-CoV-2 virus that causes COVID-19. Institutional protocols and algorithms that pertain to the evaluation of patients at risk for COVID-19 are in a state of rapid change based on information released by regulatory bodies including the CDC and federal and state organizations. These policies and algorithms were followed during the patient's care in the ED.     No evidence of superimposed infection. Vaseline gauze. Instructed not to rupture blister.  Final Clinical Impression(s) / ED Diagnoses Final diagnoses:  Blister  The patient appears reasonably screened and/or stabilized for discharge and I doubt any other medical condition or other [facility] requiring further screening, evaluation, or treatment in the ED at this time prior to discharge. Safe for discharge with strict return precautions.  Disposition: Discharge  Condition: Good  I have discussed the results, Dx and Tx plan with the patient/family who expressed understanding and agree(s) with the plan. Discharge instructions discussed at length. The patient/family was given strict return precautions who verbalized understanding of the instructions. No further questions at time of discharge.    ED Discharge Orders     None        Follow Up: Primary care provider  Call  to schedule an appointment for close follow up      This chart was dictated using voice recognition software.  Despite best efforts to proofread,  errors can occur which can change the documentation meaning.    Nira Conn, MD 08/16/21 [phone]

## 2021-08-29 ENCOUNTER — Ambulatory Visit (HOSPITAL_COMMUNITY)
Admission: EM | Admit: 2021-08-29 | Discharge: 2021-08-29 | Disposition: A | Discharge status: Home / Self Care | Payer: Medicare Other | Attending: Emergency Medicine | Admitting: Emergency Medicine

## 2021-08-29 ENCOUNTER — Other Ambulatory Visit: Payer: Self-pay

## 2021-08-29 ENCOUNTER — Encounter (HOSPITAL_COMMUNITY): Payer: Self-pay

## 2021-08-29 DIAGNOSIS — R81 Glycosuria: Secondary | ICD-10-CM

## 2021-08-29 DIAGNOSIS — E11649 Type 2 diabetes mellitus with hypoglycemia without coma: Secondary | ICD-10-CM | POA: Diagnosis not present

## 2021-08-29 LAB — POCT URINALYSIS DIPSTICK, ED / UC
Bilirubin Urine: NEGATIVE
Glucose, UA: >=1000 mg/dL — AB
Hgb urine dipstick: NEGATIVE
Leukocytes,Ua: NEGATIVE
Nitrite: NEGATIVE
Protein, ur: NEGATIVE mg/dL
Specific Gravity, Urine: 1.025 (ref 1.005–1.030)
Urobilinogen, UA: 1 mg/dL (ref 0.0–1.0)
pH: 5.5 (ref 5.0–8.0)
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
## 2021-08-29 NOTE — Discharge Instructions (Addendum)
As we discussed during your visit today, you have a significant amount of sugar in your urine which is not related to your pain.  It can be related to your sugar and carbohydrate intake however it is most likely due to to noncompliance with your type 2 diabetes medication regimen.  I do recommend that you resume all medications and take them regularly as you stated you would plan to do.  I also strongly recommend that you obtain a primary care provider for closer follow-up of your type 2 diabetes.  Please keep in mind that the risks of poorly controlled diabetes include and are not limited to myocardial infarction, stroke, blindness, loss of limb and kidney failure.

## 2021-09-01 ENCOUNTER — Telehealth (HOSPITAL_COMMUNITY): Payer: Self-pay | Admitting: Emergency Medicine

## 2021-09-01 LAB — URINE CULTURE: Culture: 60000 — AB

## 2021-09-01 MED ORDER — CIPROFLOXACIN HCL 500 MG PO TABS: 500.0000 mg | ORAL_TABLET | Freq: Two times a day (BID) | ORAL | 0 refills | Status: AC

## 2021-09-10 ENCOUNTER — Encounter: Payer: Self-pay | Admitting: Psychology
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
## 2021-10-19 ENCOUNTER — Ambulatory Visit (INDEPENDENT_AMBULATORY_CARE_PROVIDER_SITE_OTHER): Payer: Medicare Other | Admitting: Physical Medicine and Rehabilitation

## 2021-10-19 ENCOUNTER — Other Ambulatory Visit: Payer: Self-pay

## 2021-10-19 ENCOUNTER — Encounter: Payer: Self-pay | Admitting: Physical Medicine and Rehabilitation

## 2021-10-19 ENCOUNTER — Ambulatory Visit: Payer: Self-pay

## 2021-10-19 VITALS — BP 145/80 | HR 76

## 2021-10-19 DIAGNOSIS — M5412 Radiculopathy, cervical region: Secondary | ICD-10-CM

## 2021-10-19 DIAGNOSIS — M545 Low back pain, unspecified: Secondary | ICD-10-CM

## 2021-10-19 DIAGNOSIS — G894 Chronic pain syndrome: Secondary | ICD-10-CM

## 2021-10-19 DIAGNOSIS — M5010 Cervical disc disorder with radiculopathy, unspecified cervical region: Secondary | ICD-10-CM | POA: Diagnosis not present

## 2021-10-19 DIAGNOSIS — M7918 Myalgia, other site: Secondary | ICD-10-CM

## 2021-10-19 DIAGNOSIS — F4310 Post-traumatic stress disorder, unspecified: Secondary | ICD-10-CM

## 2021-10-19 DIAGNOSIS — G8929 Other chronic pain: Secondary | ICD-10-CM

## 2021-10-19 DIAGNOSIS — M542 Cervicalgia: Secondary | ICD-10-CM

## 2021-10-19 MED ORDER — HYDROCODONE-ACETAMINOPHEN 5-325 MG PO TABS: 1.0000 | ORAL_TABLET | Freq: Three times a day (TID) | ORAL | 0 refills | Status: AC | PRN

## 2021-10-19 MED ORDER — BETAMETHASONE SOD PHOS & ACET 6 (3-3) MG/ML IJ SUSP
12.0000 mg | Freq: Once | INTRAMUSCULAR | Status: AC
  Administered 2021-10-19: 12 mg

## 2021-10-19 NOTE — Progress Notes (Signed)
Pt state neck pain that travels to both shoulders. Pt state turning his head and bending over makes the pain worse. Pt state he has hard time sleeping st night. Pt state he take pain meds to help ease his pain.  Numeric Pain Rating Scale and Functional Assessment Average Pain 7   In the last MONTH (on 0-10 scale) has pain interfered with the following?  1. General activity like being  able to carry out your everyday physical activities such as walking, climbing stairs, carrying groceries, or moving a chair?  Rating(10)   +Driver, -BT, -Dye Allergies.
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
## 2021-10-19 NOTE — Patient Instructions (Signed)

## 2021-10-30 ENCOUNTER — Encounter: Payer: Self-pay | Admitting: Physical Medicine and Rehabilitation

## 2021-10-30 ENCOUNTER — Other Ambulatory Visit: Payer: Self-pay

## 2021-10-30 ENCOUNTER — Ambulatory Visit (INDEPENDENT_AMBULATORY_CARE_PROVIDER_SITE_OTHER): Payer: Medicare Other | Admitting: Physical Medicine and Rehabilitation

## 2021-10-30 VITALS — BP 153/82 | HR 81

## 2021-10-30 DIAGNOSIS — M545 Low back pain, unspecified: Secondary | ICD-10-CM | POA: Diagnosis not present

## 2021-10-30 DIAGNOSIS — G8929 Other chronic pain: Secondary | ICD-10-CM | POA: Diagnosis not present

## 2021-10-30 DIAGNOSIS — M47816 Spondylosis without myelopathy or radiculopathy, lumbar region: Secondary | ICD-10-CM

## 2021-10-30 DIAGNOSIS — M7918 Myalgia, other site: Secondary | ICD-10-CM | POA: Diagnosis not present

## 2021-10-30 DIAGNOSIS — F4310 Post-traumatic stress disorder, unspecified: Secondary | ICD-10-CM

## 2021-10-30 MED ORDER — HYDROCODONE-ACETAMINOPHEN 5-325 MG PO TABS: 1.0000 | ORAL_TABLET | Freq: Three times a day (TID) | ORAL | 0 refills | Status: AC | PRN
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
## 2021-10-30 NOTE — Progress Notes (Signed)
Pt state lower back pain. Pt state walking, standing and bending makes to the pain worse. Pt state he take pain meds to help ease his pain.  Numeric Pain Rating Scale and Functional Assessment Average Pain 10 Pain Right Now 8 My pain is constant, sharp, and aching Pain is worse with: walking, bending, and standing Pain improves with: therapy/exercise, medication, and injections   In the last MONTH (on 0-10 scale) has pain interfered with the following?  1. General activity like being  able to carry out your everyday physical activities such as walking, climbing stairs, carrying groceries, or moving a chair?  Rating(7)  2. Relation with others like being able to carry out your usual social activities and roles such as  activities at home, at work and in your community. Rating(8)  3. Enjoyment of life such that you have  been bothered by emotional problems such as feeling anxious, depressed or irritable?  Rating(9)

## 2021-10-31 MED ORDER — METHYLPREDNISOLONE ACETATE 40 MG/ML IJ SUSP
80.0000 mg | INTRAMUSCULAR | Status: AC | PRN
  Administered 2021-10-30: 80 mg via INTRAMUSCULAR

## 2021-10-31 MED ORDER — LIDOCAINE HCL 1 % IJ SOLN
5.0000 mL | INTRAMUSCULAR | Status: AC | PRN
  Administered 2021-10-30: 5 mL

## 2021-11-01 NOTE — Procedures (Signed)
Cervical Epidural Steroid Injection - Interlaminar Approach with Fluoroscopic Guidance  Patient: Tony [name]      Date of Birth: 08-07-1951 MRN: [id number] PCP: Pcp, No      Visit Date: 10/19/2021   Universal Protocol:    Date/Time: 11/16/225:26 AM  Consent Given By: the patient  Position: PRONE  Additional Comments: Vital signs were monitored before and after the procedure. Patient was prepped and draped in the usual sterile fashion. The correct patient, procedure, and site was verified.   Injection Procedure Details:   Procedure diagnoses: Cervical radiculopathy [M54.12]    Meds Administered:  Meds ordered this encounter  Medications   betamethasone acetate-betamethasone sodium phosphate (CELESTONE) injection 12 mg   HYDROcodone-acetaminophen (NORCO/VICODIN) 5-325 MG tablet    Sig: Take 1 tablet by mouth every 8 (eight) hours as needed for up to 5 days for moderate pain or severe pain.    Dispense:  15 tablet    Refill:  0     Laterality: Left  Location/Site: C7-T1  Needle: 3.5 in., 20 ga. Tuohy  Needle Placement: Paramedian epidural space  Findings:  -Comments: Excellent flow of contrast into the epidural space.  Procedure Details: Using a paramedian approach from the side mentioned above, the region overlying the inferior lamina was localized under fluoroscopic visualization and the soft tissues overlying this structure were infiltrated with 4 ml. of 1% Lidocaine without Epinephrine. A # 20 gauge, Tuohy needle was inserted into the epidural space using a paramedian approach.  The epidural space was localized using loss of resistance along with contralateral oblique bi-planar fluoroscopic views.  After negative aspirate for air, blood, and CSF, a 2 ml. volume of Isovue-250 was injected into the epidural space and the flow of contrast was observed. Radiographs were obtained for documentation purposes.   The injectate was administered into the level noted  above.  Additional Comments:  The patient tolerated the procedure well Dressing: 2 x 2 sterile gauze and Band-Aid    Post-procedure details: Patient was observed during the procedure. Post-procedure instructions were reviewed.  Patient left the clinic in stable condition.

## 2021-11-02 ENCOUNTER — Other Ambulatory Visit: Payer: Self-pay

## 2021-11-02 ENCOUNTER — Encounter: Payer: Medicare Other | Attending: Psychology | Admitting: Psychology

## 2021-11-02 ENCOUNTER — Encounter: Payer: Self-pay | Admitting: Psychology

## 2021-11-02 DIAGNOSIS — G894 Chronic pain syndrome: Secondary | ICD-10-CM | POA: Insufficient documentation

## 2021-11-02 DIAGNOSIS — F4310 Post-traumatic stress disorder, unspecified: Secondary | ICD-10-CM | POA: Insufficient documentation

## 2021-11-02 DIAGNOSIS — M545 Low back pain, unspecified: Secondary | ICD-10-CM | POA: Diagnosis present

## 2021-11-02 DIAGNOSIS — M47816 Spondylosis without myelopathy or radiculopathy, lumbar region: Secondary | ICD-10-CM | POA: Insufficient documentation

## 2021-11-02 DIAGNOSIS — G8929 Other chronic pain: Secondary | ICD-10-CM | POA: Insufficient documentation

## 2021-11-02 DIAGNOSIS — M5412 Radiculopathy, cervical region: Secondary | ICD-10-CM | POA: Diagnosis present
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
## 2021-11-06 ENCOUNTER — Telehealth (INDEPENDENT_AMBULATORY_CARE_PROVIDER_SITE_OTHER): Payer: Medicare Other | Admitting: Psychiatry

## 2021-11-06 ENCOUNTER — Encounter (HOSPITAL_COMMUNITY): Payer: Self-pay | Admitting: Psychiatry

## 2021-11-06 DIAGNOSIS — F410 Panic disorder [episodic paroxysmal anxiety] without agoraphobia: Secondary | ICD-10-CM | POA: Diagnosis not present

## 2021-11-06 DIAGNOSIS — F4310 Post-traumatic stress disorder, unspecified: Secondary | ICD-10-CM | POA: Diagnosis not present

## 2021-11-06 MED ORDER — ESCITALOPRAM OXALATE 10 MG PO TABS: 10.0000 mg | ORAL_TABLET | Freq: Every day | ORAL | 0 refills | Status: DC
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
## 2021-11-13 ENCOUNTER — Other Ambulatory Visit (HOSPITAL_COMMUNITY): Payer: Self-pay | Admitting: Psychiatry

## 2021-11-27 ENCOUNTER — Other Ambulatory Visit: Payer: Self-pay

## 2021-11-27 ENCOUNTER — Ambulatory Visit (INDEPENDENT_AMBULATORY_CARE_PROVIDER_SITE_OTHER): Payer: Medicare Other | Admitting: Family Medicine

## 2021-11-27 ENCOUNTER — Encounter (HOSPITAL_BASED_OUTPATIENT_CLINIC_OR_DEPARTMENT_OTHER): Payer: Self-pay | Admitting: Family Medicine

## 2021-11-27 VITALS — BP 120/84 | HR 113 | Ht 73.0 in | Wt 232.0 lb

## 2021-11-27 DIAGNOSIS — G894 Chronic pain syndrome: Secondary | ICD-10-CM

## 2021-11-27 DIAGNOSIS — E1100 Type 2 diabetes mellitus with hyperosmolarity without nonketotic hyperglycemic-hyperosmolar coma (NKHHC): Secondary | ICD-10-CM

## 2021-11-27 DIAGNOSIS — M5010 Cervical disc disorder with radiculopathy, unspecified cervical region: Secondary | ICD-10-CM

## 2021-11-27 DIAGNOSIS — Z794 Long term (current) use of insulin: Secondary | ICD-10-CM | POA: Diagnosis not present

## 2021-11-27 DIAGNOSIS — E119 Type 2 diabetes mellitus without complications: Secondary | ICD-10-CM | POA: Diagnosis not present

## 2021-11-27 NOTE — Patient Instructions (Signed)
  Medication Instructions:  Your physician recommends that you continue on your current medications as directed. Please refer to the Current Medication list given to you today. --If you need a refill on any your medications before your next appointment, please call your pharmacy first. If no refills are authorized on file call the office.-- Lab Work: Your physician has recommended that you have lab work today: A1C If you have labs (blood work) drawn today and your tests are completely normal, you will receive your results via MyChart message OR a phone call from our staff.  Please ensure you check your voicemail in the event that you authorized detailed messages to be left on a delegated number. If you have any lab test that is abnormal or we need to change your treatment, we will call you to review the results.  Follow-Up: Your next appointment:   Your physician recommends that you schedule a follow-up appointment in: FEB 2023 with Dr. de Peru  You will receive a text message or e-mail with a link to a survey about your care and experience with [place] today! We would greatly appreciate your feedback!   Thanks for letting us be apart of your health journey!!  Primary Care and Sports Medicine   Dr. Ceasar Mons Peru   We encourage you to activate your patient portal called "MyChart".  Sign up information is provided on this After Visit Summary.  MyChart is used to connect with patients for Virtual Visits (Telemedicine).  Patients are able to view lab/test results, encounter notes, upcoming appointments, etc.  Non-urgent messages can be sent to your provider as well. To learn more about what you can do with MyChart, please visit --  [email or web address].
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
## 2021-11-28 DIAGNOSIS — E119 Type 2 diabetes mellitus without complications: Secondary | ICD-10-CM | POA: Insufficient documentation

## 2021-11-28 LAB — HEMOGLOBIN A1C
Est. average glucose Bld gHb Est-mCnc: 177 mg/dL
Hgb A1c MFr Bld: 7.8 % — ABNORMAL HIGH (ref 4.8–5.6)

## 2021-11-28 NOTE — Assessment & Plan Note (Signed)
Has been receiving intermittent narcotic medication to help with pain control. Will refer to pain management for continued management of therapy

## 2021-11-28 NOTE — Assessment & Plan Note (Signed)
Discussed that if continuing with oxycodone, will need to be managed by pain management, referral placed

## 2021-11-28 NOTE — Assessment & Plan Note (Signed)
Uncertain control - no recent A1c Will check a1c today, will also need to complete screenings - nephropathy, retinopathy, foot exam Continue with current regimen at this time

## 2021-12-27 ENCOUNTER — Telehealth: Payer: Self-pay

## 2021-12-27 NOTE — Telephone Encounter (Signed)
Called pt and LVM #1 to r/s appt for Monday Feb. 13, 2023

## 2022-01-17 ENCOUNTER — Ambulatory Visit (INDEPENDENT_AMBULATORY_CARE_PROVIDER_SITE_OTHER): Payer: Medicare Other | Admitting: Family Medicine

## 2022-01-17 ENCOUNTER — Encounter (HOSPITAL_BASED_OUTPATIENT_CLINIC_OR_DEPARTMENT_OTHER): Payer: Self-pay | Admitting: Family Medicine

## 2022-01-17 ENCOUNTER — Other Ambulatory Visit: Payer: Self-pay

## 2022-01-17 VITALS — BP 126/84 | HR 107 | Ht 73.0 in | Wt 245.0 lb

## 2022-01-17 DIAGNOSIS — Z794 Long term (current) use of insulin: Secondary | ICD-10-CM

## 2022-01-17 DIAGNOSIS — G894 Chronic pain syndrome: Secondary | ICD-10-CM | POA: Diagnosis not present

## 2022-01-17 DIAGNOSIS — E1165 Type 2 diabetes mellitus with hyperglycemia: Secondary | ICD-10-CM

## 2022-01-17 NOTE — Assessment & Plan Note (Signed)
Most recent hemoglobin A1c was 7.8% Patient continues on empagliflozin, pioglitazone, Sitagliptin, metformin, Lantus.  Indicates that he takes 80 units of Lantus daily Discussed elevated A1c and need for better control of blood sugars.  Patient indicates that he feels prior elevation was related to poor dietary choices Discussed options of medication adjustments, patient declines making any changes at this time and instead would prefer to focus on lifestyle modifications Discussed need to limit carbohydrates/simple sugars in the diet and avoid processed foods Plan for repeat A1c at future office visit in about 2 to 3 months

## 2022-01-17 NOTE — Patient Instructions (Signed)
°  Medication Instructions:  °Your physician recommends that you continue on your current medications as directed. Please refer to the Current Medication list given to you today. °--If you need a refill on any your medications before your next appointment, please call your pharmacy first. If no refills are authorized on file call the office.-- ° °Follow-Up: °Your next appointment:   °Your physician recommends that you schedule a follow-up appointment in: 2-3 MONTHS with Dr. de [place] ° °You will receive a text message or e-mail with a link to a survey about your care and experience with us today! We would greatly appreciate your feedback!  ° °Thanks for letting us be apart of your health journey!!  °Primary Care and Sports Medicine  ° °Dr. Raymond de [place]  ° °We encourage you to activate your patient portal called "MyChart".  Sign up information is provided on this After Visit Summary.  MyChart is used to connect with patients for Virtual Visits (Telemedicine).  Patients are able to view lab/test results, encounter notes, upcoming appointments, etc.  Non-urgent messages can be sent to your provider as well. To learn more about what you can do with MyChart, please visit --  [email or web address]   ° °

## 2022-01-17 NOTE — Progress Notes (Signed)
° ° °  Procedures performed today:    None.  Independent interpretation of notes and tests performed by another provider:   None.  Brief History, Exam, Impression, and Recommendations:    BP 126/84    Pulse (!) 107    Ht 6\' 1"  (1.854 m)    Wt 245 lb (111.1 kg)    SpO2 100%    BMI 32.32 kg/m   Diabetes (HCC) Most recent hemoglobin A1c was 7.8% Patient continues on empagliflozin, pioglitazone, Sitagliptin, metformin, Lantus.  Indicates that he takes 80 units of Lantus daily Discussed elevated A1c and need for better control of blood sugars.  Patient indicates that he feels prior elevation was related to poor dietary choices Discussed options of medication adjustments, patient declines making any changes at this time and instead would prefer to focus on lifestyle modifications Discussed need to limit carbohydrates/simple sugars in the diet and avoid processed foods Plan for repeat A1c at future office visit in about 2 to 3 months  Chronic pain syndrome Has established with pain management, recommend continuing close follow-up for adequate control of chronic pain  Patient with request regarding documentation for expected prognosis related to MSK issues primarily.  This is particularly in regards to his chronic neck and back pain.  He is currently seeing orthopedic surgeon related to these issues with planned procedures upcoming. Discussed that given issues and need for estimating prognosis, long-term issues, recommend patient obtain this evaluation through his orthopedic surgeons.  Patient voiced understanding and agreement  Spent 35 minutes on this patient encounter, including preparation, chart review, face-to-face counseling with patient and coordination of care, and documentation of encounter  Plan for follow-up in about 2 to 3 months or sooner as needed.  Plan for A1c check at next visit.   ___________________________________________ Anastaisa Wooding de , MD, ABFM, [facility]

## 2022-01-17 NOTE — Assessment & Plan Note (Signed)
Has established with pain management, recommend continuing close follow-up for adequate control of chronic pain

## 2022-01-19 ENCOUNTER — Telehealth (HOSPITAL_BASED_OUTPATIENT_CLINIC_OR_DEPARTMENT_OTHER): Payer: Self-pay

## 2022-01-19 MED ORDER — LIDOCAINE 5 % EX PTCH: 1.0000 | MEDICATED_PATCH | Freq: Every day | CUTANEOUS | 0 refills | Status: DC

## 2022-01-19 NOTE — Telephone Encounter (Signed)
Received fax from [facility] pharmacy ( [address] )  Per Dr Ihor Dow okay to refill.

## 2022-01-29 ENCOUNTER — Ambulatory Visit: Payer: Self-pay

## 2022-01-29 ENCOUNTER — Other Ambulatory Visit: Payer: Self-pay

## 2022-01-29 ENCOUNTER — Ambulatory Visit (INDEPENDENT_AMBULATORY_CARE_PROVIDER_SITE_OTHER): Payer: Medicare Other | Admitting: Physical Medicine and Rehabilitation

## 2022-01-29 ENCOUNTER — Encounter: Payer: Self-pay | Admitting: Physical Medicine and Rehabilitation

## 2022-01-29 VITALS — BP 158/88 | HR 80

## 2022-01-29 DIAGNOSIS — M5412 Radiculopathy, cervical region: Secondary | ICD-10-CM | POA: Diagnosis not present

## 2022-01-29 DIAGNOSIS — M5010 Cervical disc disorder with radiculopathy, unspecified cervical region: Secondary | ICD-10-CM

## 2022-01-29 DIAGNOSIS — M542 Cervicalgia: Secondary | ICD-10-CM

## 2022-01-29 DIAGNOSIS — M47812 Spondylosis without myelopathy or radiculopathy, cervical region: Secondary | ICD-10-CM

## 2022-01-29 MED ORDER — METHYLPREDNISOLONE ACETATE 80 MG/ML IJ SUSP
80.0000 mg | Freq: Once | INTRAMUSCULAR | Status: AC
  Administered 2022-01-29: 80 mg
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
## 2022-01-29 NOTE — Progress Notes (Signed)
Pt state neck pain that travels to both shoulders. Pt state turning his head and bending over makes the pain worse. Pt state he has hard time sleeping st night. Pt state he take pain meds to help ease his pain.  Numeric Pain Rating Scale and Functional Assessment Average Pain 10   In the last MONTH (on 0-10 scale) has pain interfered with the following?  1. General activity like being  able to carry out your everyday physical activities such as walking, climbing stairs, carrying groceries, or moving a chair?  Rating(10)   +Driver, -BT, -Dye Allergies.

## 2022-01-29 NOTE — Patient Instructions (Signed)
Pleae give the MRI order 2 weeks to clear our office and insurance prior authorization. Next step will be a call from the Imaging place to schedule at time, again this may take a few weeks. [facility] Discharge Instructions  *At any time if you have questions or concerns they can be answered by calling [phone]  All Patients: You may experience an increase in your symptoms for the first 2 days (it can take 2 days to 2 weeks for the steroid/cortisone to have its maximal effect). You may use ice to the site for the first 24 hours; 20 minutes on and 20 minutes off and may use heat after that time. You may resume and continue your current pain medications. If you need a refill please contact the prescribing physician. You may resume your medications if any were stopped for the procedure. You may shower but no swimming, tub bath or Jacuzzi for 24 hours. Please remove bandage after 4 hours. You may resume light activities as tolerated. If you had Spine Injection, you should not drive for the next 3 hours due to anesthetics used in the procedure. Please have someone drive for you.  *If you have had sedation, Valium, Xanax, or lorazepam: Do not drive or use public transportation for 24 hours, do not operating hazardous machinery or make important personal/business decisions for 24 hours.  POSSIBLE STEROID SIDE EFFECTS: If experienced these should only last for a short period. Change in menstrual flow  Edema in (swelling)  Increased appetite Skin flushing (redness)  Skin rash/acne  Thrush (oral) Vaginitis    Increased sweating  Depression Increased blood glucose levels Cramping and leg/calf  Euphoria (feeling happy)  POSSIBLE PROCEDURE SIDE EFFECTS: Please call our office if concerned. Increased pain Increased numbness/tingling  Headache Nausea/vomiting Hematoma (bruising/bleeding) Edema (swelling at the site) Weakness  Infection (red/drainage at site) Fever greater than  100.41F  *In the event of a headache after epidural steroid injection: Drink plenty of fluids, especially water and try to lay flat when possible. If the headache does not get better after a few days or as always if concerned please call the office.

## 2022-02-01 ENCOUNTER — Ambulatory Visit: Payer: Medicare Other | Admitting: Physical Medicine and Rehabilitation

## 2022-02-02 ENCOUNTER — Ambulatory Visit
Admission: RE | Admit: 2022-02-02 | Discharge: 2022-02-02 | Disposition: A | Discharge status: Home / Self Care | Payer: Medicare Other | Source: Ambulatory Visit | Attending: Physical Medicine and Rehabilitation | Admitting: Physical Medicine and Rehabilitation

## 2022-02-02 ENCOUNTER — Other Ambulatory Visit: Payer: Self-pay

## 2022-02-02 ENCOUNTER — Telehealth: Payer: Self-pay | Admitting: Physical Medicine and Rehabilitation

## 2022-02-02 IMAGING — MR MR CERVICAL SPINE W/O CM
5 series · 35 of 48 positions shown · non-contrast
Comparison: Cervical spine MRI [DATE]

CLINICAL DATA: Cervical spondylosis without myelopathy. Cervical
radiculopathy. Chronic neck and upper back pain. Pain, weakness, and
numbness in the upper and lower extremities.

EXAM:
MRI CERVICAL SPINE WITHOUT CONTRAST
TECHNIQUE: Multiplanar, multisequence MR imaging of the cervical spine was
performed. No intravenous contrast was administered.

[Series 2: T2 · sagittal · 3.0mm · 0.49mm/px · 8 of 16 slices shown (1 of 2)]
[im 1/16]
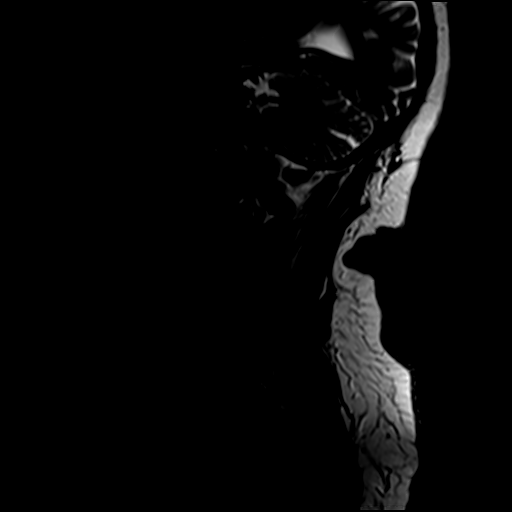
[im 3/16]
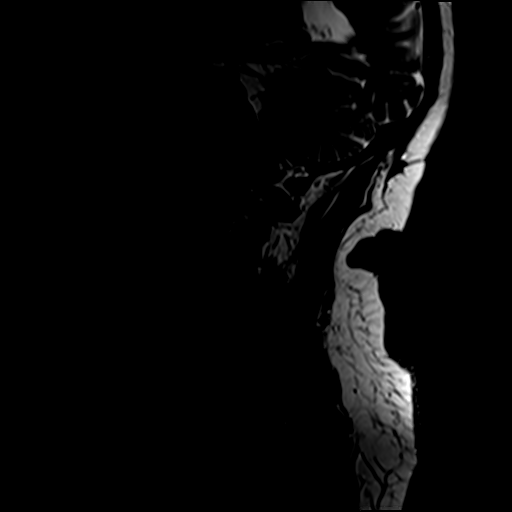
[im 5/16]
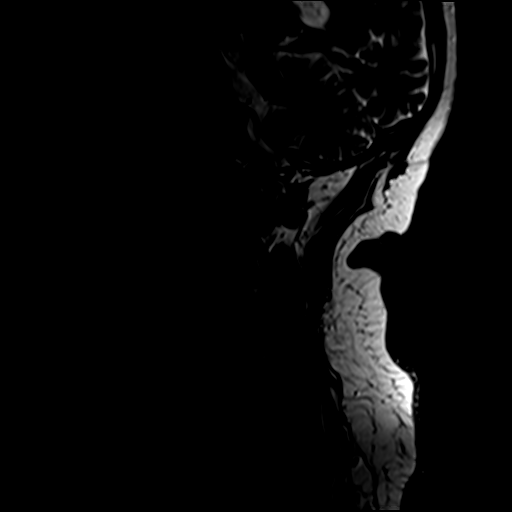
[im 7/16]
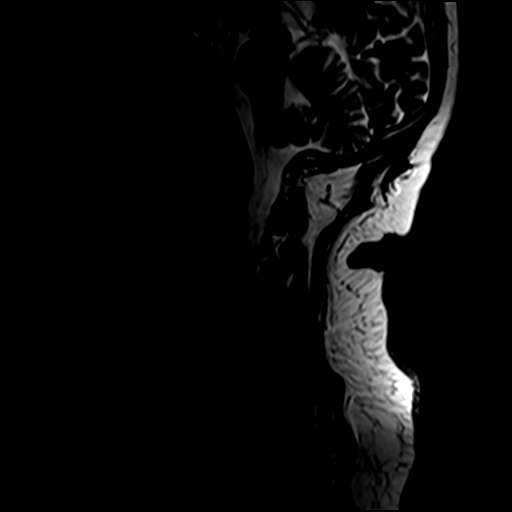
[im 9/16]
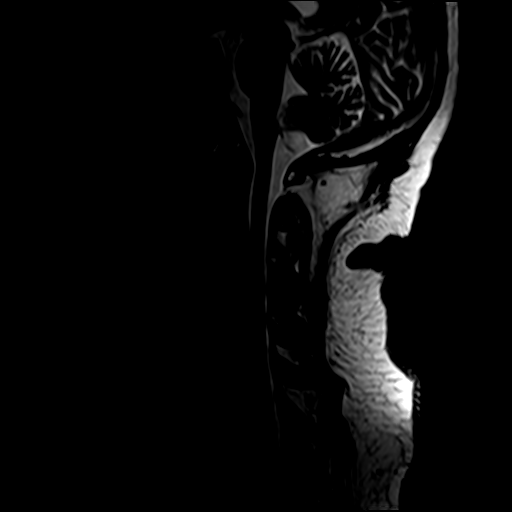
[im 11/16]
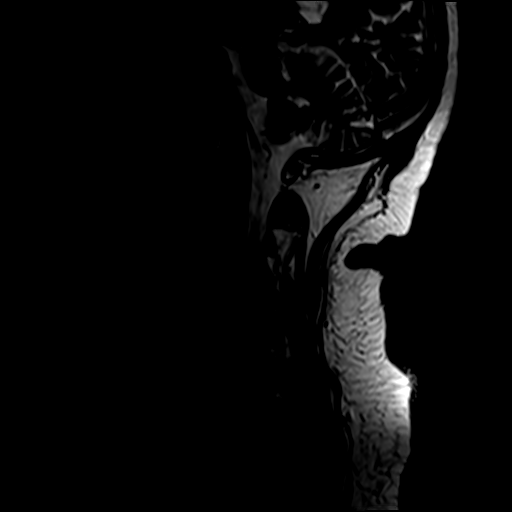
[im 13/16]
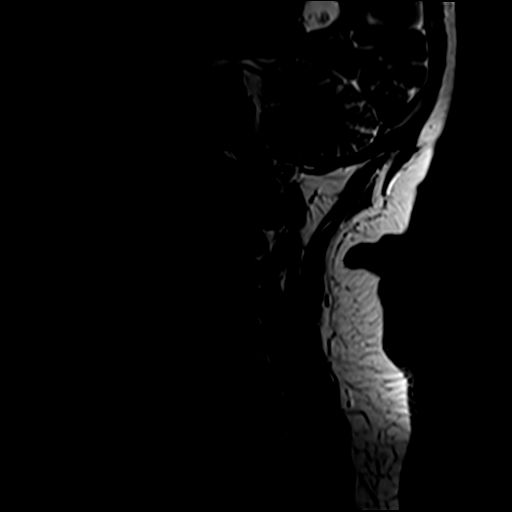
[im 16/16]
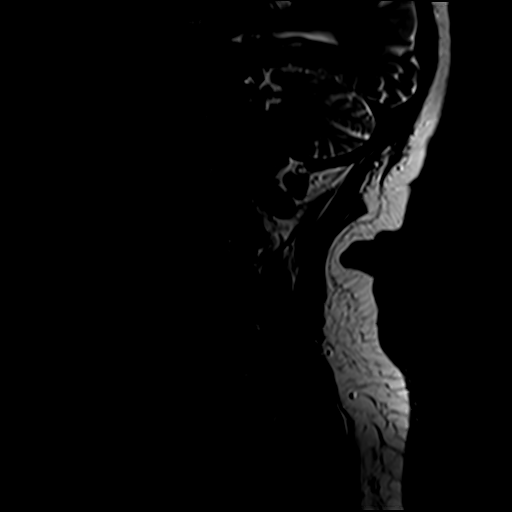

[Series 3: STIR · sagittal · 3.0mm · 0.98mm/px · 7 of 16 slices shown]
[im 1/16]
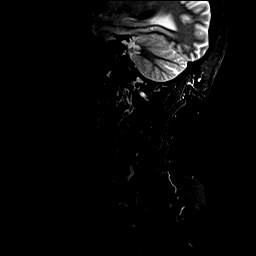
[im 3/16]
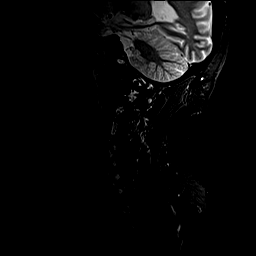
[im 6/16]
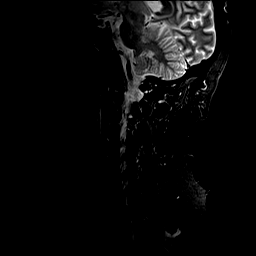
[im 8/16]
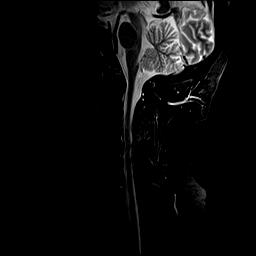
[im 11/16]
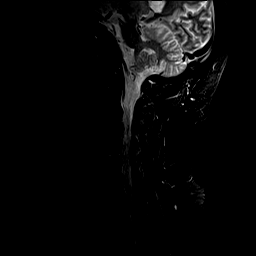
[im 13/16]
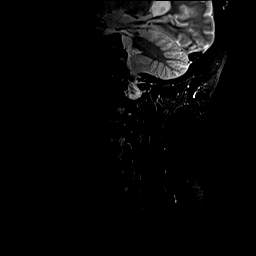
[im 16/16]
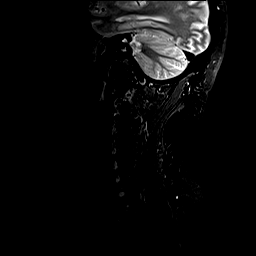

[Series 4: T1 · sagittal · 3.0mm · 0.98mm/px · 7 of 16 slices shown]
[im 1/16]
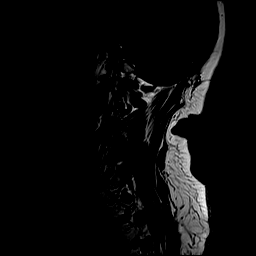
[im 3/16]
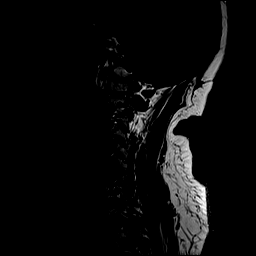
[im 6/16]
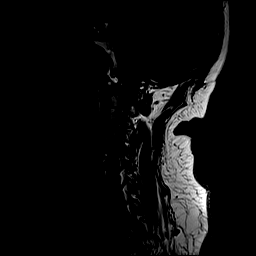
[im 8/16]
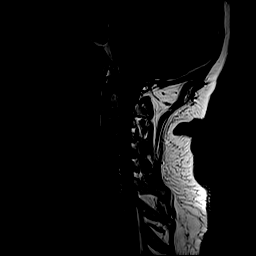
[im 11/16]
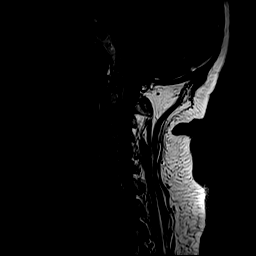
[im 13/16]
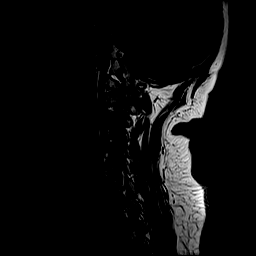
[im 16/16]
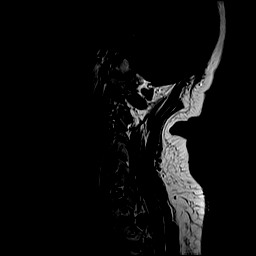

[Series 5: T2 · axial · 3.0mm · 0.70mm/px · z∈[-92,+6]mm · 9 of 28 slices shown (2 of 2)]
[im 1/28]
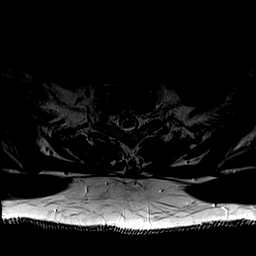
[im 5/28]
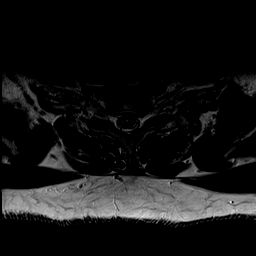
[im 10/28]
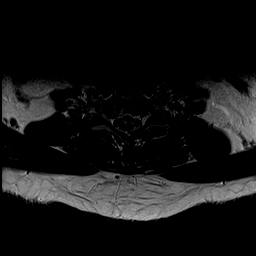
[im 12/28]
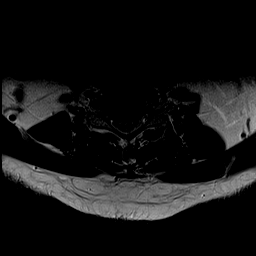
[im 14/28]
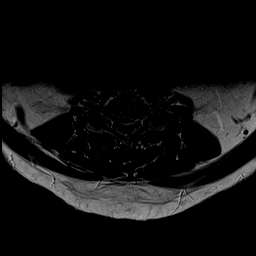
[im 16/28]
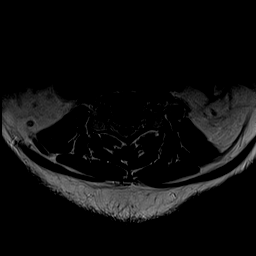
[im 19/28]
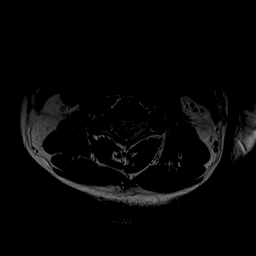
[im 23/28]
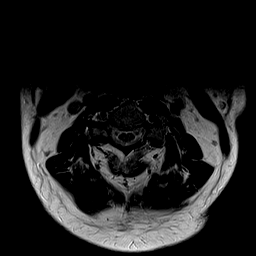
[im 28/28]
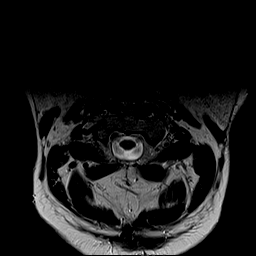

[Series 6: GRE · axial · 3.0mm · 0.35mm/px · z∈[-92,-52]mm · 4 of 28 slices shown]
[im 1/28]
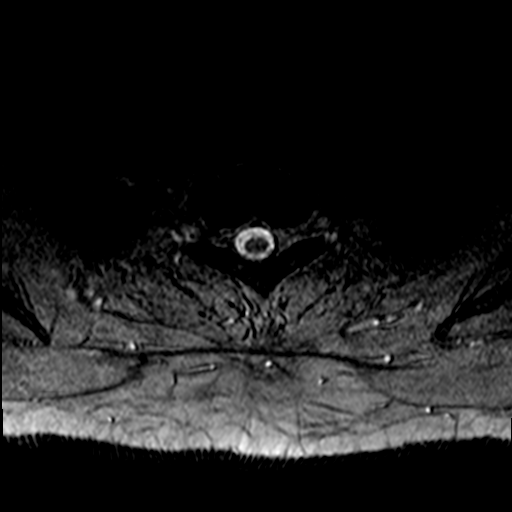
[im 5/28]
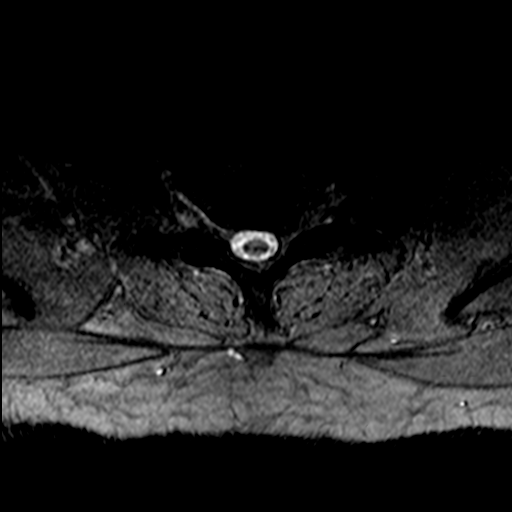
[im 10/28]
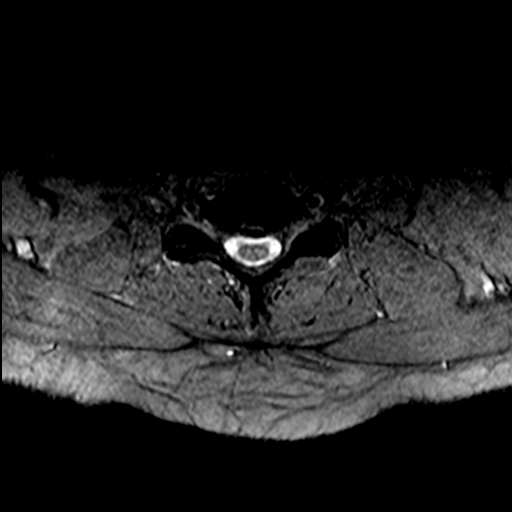
[im 12/28]
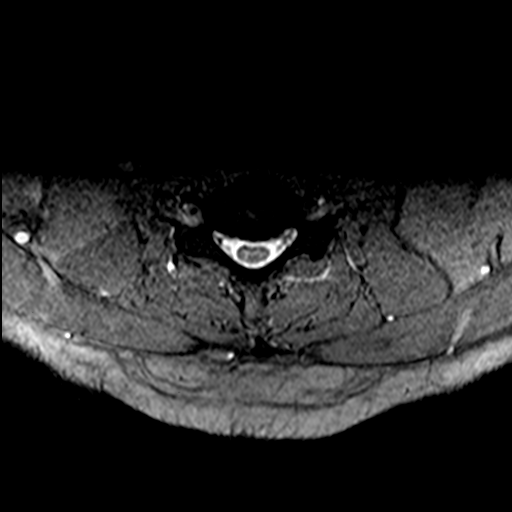

[35 of 48 positions shown; findings below may reference images not displayed]

FINDINGS: Alignment: Cervical spine straightening.  No listhesis.

Vertebrae: No fracture, suspicious marrow lesion, or significant
marrow edema.

Cord: Normal signal and morphology.

Posterior Fossa, vertebral arteries, paraspinal tissues:
Unremarkable.

Disc levels:

C2-3: Uncovertebral spurring and moderate right and mild left facet
arthrosis result in mild right neural foraminal stenosis without
spinal stenosis, unchanged.

C3-4: Mild disc bulging, asymmetric right uncovertebral spurring,
and mild facet arthrosis result in borderline to mild spinal
stenosis and moderate right neural foraminal stenosis, unchanged.

C4-5: Disc bulging, uncovertebral spurring, and mild facet arthrosis
result in mild spinal stenosis and mild-to-moderate left neural
foraminal stenosis, unchanged.

C5-6: Disc bulging and uncovertebral spurring result in mild spinal
stenosis and mild-to-moderate right and borderline left neural
foraminal stenosis, unchanged.

C6-7: Disc bulging and uncovertebral spurring result in borderline
spinal stenosis and moderate right neural foraminal stenosis,
unchanged.

C7-T1: Minimal disc bulging and minimal facet arthrosis without
stenosis, unchanged.
IMPRESSION: Unchanged cervical disc and facet degeneration resulting in up to
mild spinal stenosis and moderate neural foraminal stenosis as
above.

## 2022-02-02 NOTE — Telephone Encounter (Signed)
Can you call him and tell him new Cervical MRI is unchanged from last MRI. Copy in mychart if he needs it. Continue current treatment

## 2022-02-03 NOTE — Procedures (Signed)
Cervical Epidural Steroid Injection - Interlaminar Approach with Fluoroscopic Guidance  Patient: Tony [name]      Date of Birth: [birth year] MRN: [id number] PCP: de [place], Raymond J, MD      Visit Date: 01/29/2022   Universal Protocol:    Date/Time: 02/03/2309:11 AM  Consent Given By: the patient  Position: PRONE  Additional Comments: Vital signs were monitored before and after the procedure. Patient was prepped and draped in the usual sterile fashion. The correct patient, procedure, and site was verified.   Injection Procedure Details:   Procedure diagnoses: Cervical radiculopathy [M54.12]    Meds Administered:  Meds ordered this encounter  Medications   methylPREDNISolone acetate (DEPO-MEDROL) injection 80 mg     Laterality: Right  Location/Site: C7-T1  Needle: 3.5 in., 20 ga. Tuohy  Needle Placement: Paramedian epidural space  Findings:  -Comments: Excellent flow of contrast into the epidural space.  Procedure Details: Using a paramedian approach from the side mentioned above, the region overlying the inferior lamina was localized under fluoroscopic visualization and the soft tissues overlying this structure were infiltrated with 4 ml. of 1% Lidocaine without Epinephrine. A # 20 gauge, Tuohy needle was inserted into the epidural space using a paramedian approach.  The epidural space was localized using loss of resistance along with contralateral oblique bi-planar fluoroscopic views.  After negative aspirate for air, blood, and CSF, a 2 ml. volume of Isovue-250 was injected into the epidural space and the flow of contrast was observed. Radiographs were obtained for documentation purposes.   The injectate was administered into the level noted above.  Additional Comments:  The patient tolerated the procedure well Dressing: 2 x 2 sterile gauze and Band-Aid    Post-procedure details: Patient was observed during the procedure. Post-procedure instructions were  reviewed.  Patient left the clinic in stable condition.

## 2022-02-06 ENCOUNTER — Other Ambulatory Visit (HOSPITAL_BASED_OUTPATIENT_CLINIC_OR_DEPARTMENT_OTHER): Payer: Self-pay | Admitting: Family Medicine

## 2022-02-13 ENCOUNTER — Other Ambulatory Visit: Payer: Self-pay

## 2022-02-13 ENCOUNTER — Encounter: Payer: Medicare Other | Attending: Psychology | Admitting: Psychology

## 2022-02-13 DIAGNOSIS — F4310 Post-traumatic stress disorder, unspecified: Secondary | ICD-10-CM | POA: Diagnosis present

## 2022-02-13 DIAGNOSIS — G894 Chronic pain syndrome: Secondary | ICD-10-CM | POA: Diagnosis present

## 2022-02-14 ENCOUNTER — Telehealth (INDEPENDENT_AMBULATORY_CARE_PROVIDER_SITE_OTHER): Payer: Medicare Other | Admitting: Psychiatry

## 2022-02-14 ENCOUNTER — Encounter (HOSPITAL_COMMUNITY): Payer: Self-pay | Admitting: Psychiatry

## 2022-02-14 DIAGNOSIS — F410 Panic disorder [episodic paroxysmal anxiety] without agoraphobia: Secondary | ICD-10-CM | POA: Diagnosis not present

## 2022-02-14 DIAGNOSIS — F4310 Post-traumatic stress disorder, unspecified: Secondary | ICD-10-CM

## 2022-02-14 MED ORDER — ESCITALOPRAM OXALATE 10 MG PO TABS: 10.0000 mg | ORAL_TABLET | Freq: Every day | ORAL | 0 refills | Status: DC

## 2022-02-14 MED ORDER — LORAZEPAM 0.5 MG PO TABS: 0.5000 mg | ORAL_TABLET | Freq: Every day | ORAL | 0 refills | Status: DC | PRN
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
## 2022-02-19 ENCOUNTER — Encounter: Payer: Self-pay | Admitting: Psychology
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
## 2022-04-16 ENCOUNTER — Encounter (HOSPITAL_BASED_OUTPATIENT_CLINIC_OR_DEPARTMENT_OTHER): Payer: Self-pay | Admitting: Family Medicine

## 2022-04-16 ENCOUNTER — Ambulatory Visit (INDEPENDENT_AMBULATORY_CARE_PROVIDER_SITE_OTHER): Payer: Medicare Other | Admitting: Family Medicine

## 2022-04-16 VITALS — BP 122/88 | HR 96 | Ht 73.0 in | Wt 245.2 lb

## 2022-04-16 DIAGNOSIS — Z794 Long term (current) use of insulin: Secondary | ICD-10-CM

## 2022-04-16 DIAGNOSIS — G894 Chronic pain syndrome: Secondary | ICD-10-CM | POA: Diagnosis not present

## 2022-04-16 DIAGNOSIS — E1165 Type 2 diabetes mellitus with hyperglycemia: Secondary | ICD-10-CM | POA: Diagnosis not present
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
## 2022-04-16 NOTE — Assessment & Plan Note (Signed)
Patient continues with same medications including empagliflozin 10 mg, Lantus 80 units, metformin 1000 mg twice daily, pioglitazone.  Patient denies any polyuria or polydipsia, generally feels that he has been doing well. ?Last hemoglobin A1c was above goal at 7.8% ?Previously, patient had been wanting to work on lifestyle modifications and not make any significant adjustments to medications ?We will check hemoglobin A1c today as well as urine microalbumin/creatinine ratio ?Further recommendations pending results of labs ?
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
## 2022-04-17 ENCOUNTER — Encounter: Payer: Self-pay | Admitting: Physical Medicine and Rehabilitation

## 2022-04-17 ENCOUNTER — Ambulatory Visit: Payer: Self-pay

## 2022-04-17 ENCOUNTER — Ambulatory Visit (INDEPENDENT_AMBULATORY_CARE_PROVIDER_SITE_OTHER): Payer: Medicare Other | Admitting: Physical Medicine and Rehabilitation

## 2022-04-17 VITALS — BP 130/85 | HR 99

## 2022-04-17 DIAGNOSIS — M5412 Radiculopathy, cervical region: Secondary | ICD-10-CM | POA: Diagnosis not present

## 2022-04-17 LAB — HEMOGLOBIN A1C
Est. average glucose Bld gHb Est-mCnc: 180 mg/dL
Hgb A1c MFr Bld: 7.9 % — ABNORMAL HIGH (ref 4.8–5.6)

## 2022-04-17 LAB — MICROALBUMIN / CREATININE URINE RATIO
Creatinine, Urine: 105.7 mg/dL
Microalb/Creat Ratio: 22 mg/g creat (ref 0–29)
Microalbumin, Urine: 23.1 ug/mL

## 2022-04-17 MED ORDER — METHYLPREDNISOLONE ACETATE 80 MG/ML IJ SUSP
80.0000 mg | Freq: Once | INTRAMUSCULAR | Status: AC
  Administered 2022-04-17: 80 mg

## 2022-04-17 NOTE — Patient Instructions (Signed)

## 2022-04-17 NOTE — Progress Notes (Signed)
Pt state neck pain that travels to both shoulders. Pt state turning his head and bending over makes the pain worse. Pt state he has hard time sleeping st night. Pt state he take pain meds to help ease his pain.  Numeric Pain Rating Scale and Functional Assessment Average Pain 9   In the last MONTH (on 0-10 scale) has pain interfered with the following?  1. General activity like being  able to carry out your everyday physical activities such as walking, climbing stairs, carrying groceries, or moving a chair?  Rating(10)   +Driver, -BT, -Dye Allergies.

## 2022-04-18 ENCOUNTER — Telehealth (HOSPITAL_BASED_OUTPATIENT_CLINIC_OR_DEPARTMENT_OTHER): Payer: Self-pay

## 2022-04-18 ENCOUNTER — Other Ambulatory Visit (HOSPITAL_BASED_OUTPATIENT_CLINIC_OR_DEPARTMENT_OTHER): Payer: Self-pay | Admitting: Family Medicine

## 2022-04-18 ENCOUNTER — Other Ambulatory Visit (HOSPITAL_BASED_OUTPATIENT_CLINIC_OR_DEPARTMENT_OTHER): Payer: Self-pay

## 2022-04-18 DIAGNOSIS — G894 Chronic pain syndrome: Secondary | ICD-10-CM

## 2022-04-18 MED ORDER — CELECOXIB 200 MG PO CAPS: ORAL_CAPSULE | ORAL | 0 refills | Status: DC

## 2022-04-18 NOTE — Telephone Encounter (Signed)
Pt called and asked if he could get one of his old medication's back. He said it expired and his previous doctor had prescribed it for him. The medication he would like to be prescribed is called Celecoxib. He was seen on 05/01.  ? ? ?Thanks, ? ?Hendricks Milo, CMA ?

## 2022-04-18 NOTE — Progress Notes (Signed)
Pt was called and left voicemail to give [place] a call back to go over lab results.

## 2022-04-20 ENCOUNTER — Telehealth: Payer: Self-pay | Admitting: Physical Medicine and Rehabilitation
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
## 2022-04-26 ENCOUNTER — Encounter: Payer: Medicare Other | Attending: Psychology | Admitting: Psychology

## 2022-04-26 DIAGNOSIS — F419 Anxiety disorder, unspecified: Secondary | ICD-10-CM | POA: Insufficient documentation

## 2022-04-26 DIAGNOSIS — F4310 Post-traumatic stress disorder, unspecified: Secondary | ICD-10-CM | POA: Insufficient documentation

## 2022-04-26 DIAGNOSIS — M5416 Radiculopathy, lumbar region: Secondary | ICD-10-CM | POA: Insufficient documentation

## 2022-04-26 DIAGNOSIS — G894 Chronic pain syndrome: Secondary | ICD-10-CM

## 2022-04-27 ENCOUNTER — Telehealth (HOSPITAL_COMMUNITY): Payer: Medicare Other | Admitting: Psychiatry

## 2022-05-01 ENCOUNTER — Ambulatory Visit (INDEPENDENT_AMBULATORY_CARE_PROVIDER_SITE_OTHER): Payer: Medicare Other | Admitting: Psychiatry

## 2022-05-01 ENCOUNTER — Encounter (HOSPITAL_COMMUNITY): Payer: Self-pay | Admitting: Psychiatry

## 2022-05-01 VITALS — BP 136/74 | HR 90 | Temp 98.2°F | Ht 73.0 in | Wt 240.0 lb

## 2022-05-01 DIAGNOSIS — F4310 Post-traumatic stress disorder, unspecified: Secondary | ICD-10-CM | POA: Diagnosis not present

## 2022-05-01 DIAGNOSIS — F410 Panic disorder [episodic paroxysmal anxiety] without agoraphobia: Secondary | ICD-10-CM

## 2022-05-01 MED ORDER — ESCITALOPRAM OXALATE 10 MG PO TABS: 10.0000 mg | ORAL_TABLET | Freq: Every day | ORAL | 0 refills | Status: DC

## 2022-05-01 MED ORDER — LORAZEPAM 0.5 MG PO TABS
0.5000 mg | ORAL_TABLET | Freq: Every day | ORAL | 0 refills | Status: DC | PRN
Start: 2022-05-01 — End: 2023-03-04
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
## 2022-05-07 NOTE — Procedures (Signed)
Cervical Epidural Steroid Injection - Interlaminar Approach with Fluoroscopic Guidance  Patient: Tony [name]      Date of Birth: Oct 11, 1951 MRN: [id number] PCP: de [place], Raymond J, MD      Visit Date: 04/17/2022   Universal Protocol:    Date/Time: 05/22/237:23 PM  Consent Given By: the patient  Position: PRONE  Additional Comments: Vital signs were monitored before and after the procedure. Patient was prepped and draped in the usual sterile fashion. The correct patient, procedure, and site was verified.   Injection Procedure Details:   Procedure diagnoses: Cervical radiculopathy [M54.12]    Meds Administered:  Meds ordered this encounter  Medications   methylPREDNISolone acetate (DEPO-MEDROL) injection 80 mg     Laterality: Left  Location/Site: C7-T1  Needle: 3.5 in., 20 ga. Tuohy  Needle Placement: Paramedian epidural space  Findings:  -Comments: Excellent flow of contrast into the epidural space.  Procedure Details: Using a paramedian approach from the side mentioned above, the region overlying the inferior lamina was localized under fluoroscopic visualization and the soft tissues overlying this structure were infiltrated with 4 ml. of 1% Lidocaine without Epinephrine. A # 20 gauge, Tuohy needle was inserted into the epidural space using a paramedian approach.  The epidural space was localized using loss of resistance along with contralateral oblique bi-planar fluoroscopic views.  After negative aspirate for air, blood, and CSF, a 2 ml. volume of Isovue-250 was injected into the epidural space and the flow of contrast was observed. Radiographs were obtained for documentation purposes.   The injectate was administered into the level noted above.  Additional Comments:  The patient tolerated the procedure well Dressing: 2 x 2 sterile gauze and Band-Aid    Post-procedure details: Patient was observed during the procedure. Post-procedure instructions were  reviewed.  Patient left the clinic in stable condition.

## 2022-05-17 ENCOUNTER — Encounter: Payer: Self-pay | Admitting: Psychology
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
## 2022-05-20 ENCOUNTER — Other Ambulatory Visit (HOSPITAL_BASED_OUTPATIENT_CLINIC_OR_DEPARTMENT_OTHER): Payer: Self-pay | Admitting: Family Medicine

## 2022-05-20 DIAGNOSIS — G894 Chronic pain syndrome: Secondary | ICD-10-CM

## 2022-07-04 DIAGNOSIS — M549 Dorsalgia, unspecified: Secondary | ICD-10-CM | POA: Insufficient documentation

## 2022-07-04 DIAGNOSIS — M5416 Radiculopathy, lumbar region: Secondary | ICD-10-CM | POA: Insufficient documentation

## 2022-07-17 ENCOUNTER — Encounter: Payer: Medicare Other | Admitting: Physical Medicine and Rehabilitation

## 2022-08-13 ENCOUNTER — Ambulatory Visit (HOSPITAL_BASED_OUTPATIENT_CLINIC_OR_DEPARTMENT_OTHER): Payer: Medicare Other | Admitting: Family Medicine

## 2022-09-17 ENCOUNTER — Telehealth (HOSPITAL_BASED_OUTPATIENT_CLINIC_OR_DEPARTMENT_OTHER): Payer: Self-pay | Admitting: Family Medicine

## 2022-09-17 NOTE — Telephone Encounter (Signed)
Received via Mail   Attending physician report,   Placed in Provider box

## 2022-10-23 ENCOUNTER — Ambulatory Visit: Payer: Medicare Other | Admitting: Psychology

## 2022-10-25 ENCOUNTER — Ambulatory Visit (HOSPITAL_COMMUNITY): Payer: Medicare Other | Admitting: Psychiatry

## 2023-01-07 ENCOUNTER — Telehealth (HOSPITAL_BASED_OUTPATIENT_CLINIC_OR_DEPARTMENT_OTHER): Payer: Self-pay | Admitting: Family Medicine

## 2023-01-07 ENCOUNTER — Encounter (HOSPITAL_BASED_OUTPATIENT_CLINIC_OR_DEPARTMENT_OTHER): Payer: Self-pay

## 2023-01-07 NOTE — Telephone Encounter (Signed)
Left message for patient to call back and schedule Medicare Annual Wellness Visit (AWV) in office.   If not able to come in office, please offer to do virtually or by telephone.  Left office number and my jabber #[phone].  AWVI eligible as of 10/18/2019  Please schedule at anytime with Nurse Health Advisor.  

## 2023-02-05 ENCOUNTER — Telehealth (HOSPITAL_BASED_OUTPATIENT_CLINIC_OR_DEPARTMENT_OTHER): Payer: Self-pay | Admitting: Family Medicine

## 2023-02-05 NOTE — Telephone Encounter (Signed)
Called patient to schedule Medicare Annual Wellness Visit (AWV). Left message for patient to call back and schedule Medicare Annual Wellness Visit (AWV).  Last date of AWV: AWVI eligible as of 10/18/2019  Please schedule an appointment at any time with [facility].  If any questions, please contact me at [phone].    Thank you,  [place] Direct dial  [phone]

## 2023-02-11 ENCOUNTER — Telehealth: Payer: Self-pay | Admitting: Physical Medicine and Rehabilitation

## 2023-02-11 DIAGNOSIS — M5412 Radiculopathy, cervical region: Secondary | ICD-10-CM

## 2023-02-11 NOTE — Telephone Encounter (Signed)
Patient called needing to schedule an appointment with Dr. Ernestina Patches for an injection in his neck and back. The number to contact patient is [phone] or [phone] Home

## 2023-02-11 NOTE — Telephone Encounter (Signed)
Ordered placed for Left C7-T1 Cervical Interlaminar epidural steroid injection

## 2023-02-11 NOTE — Telephone Encounter (Signed)
See previous encounter

## 2023-02-11 NOTE — Telephone Encounter (Signed)
Patient is requesting a neck injection. He states he is having the same type of pain and the last injection helped 8-9 months. Please advise

## 2023-02-11 NOTE — Telephone Encounter (Signed)
Has new insurance and requesting an appointment with Dr. Ernestina Patches

## 2023-02-25 ENCOUNTER — Telehealth (HOSPITAL_BASED_OUTPATIENT_CLINIC_OR_DEPARTMENT_OTHER): Payer: Self-pay | Admitting: Family Medicine

## 2023-02-25 NOTE — Telephone Encounter (Signed)
Called patient to schedule Medicare Annual Wellness Visit (AWV). Left message for patient to call back and schedule Medicare Annual Wellness Visit (AWV).  Last date of AWV: AWVI eligible as of 10/18/2019  Please schedule an AWVI appointment at any time with [facility].  If any questions, please contact me at [phone].    Thank you,  [place] Direct dial  [phone]

## 2023-02-27 ENCOUNTER — Encounter: Payer: Medicare Other | Attending: Psychology | Admitting: Psychology

## 2023-02-27 ENCOUNTER — Encounter (HOSPITAL_BASED_OUTPATIENT_CLINIC_OR_DEPARTMENT_OTHER): Payer: Self-pay

## 2023-02-27 DIAGNOSIS — F4310 Post-traumatic stress disorder, unspecified: Secondary | ICD-10-CM | POA: Diagnosis present

## 2023-02-27 DIAGNOSIS — M47816 Spondylosis without myelopathy or radiculopathy, lumbar region: Secondary | ICD-10-CM | POA: Diagnosis present

## 2023-02-27 DIAGNOSIS — G894 Chronic pain syndrome: Secondary | ICD-10-CM | POA: Insufficient documentation

## 2023-02-27 DIAGNOSIS — G8929 Other chronic pain: Secondary | ICD-10-CM | POA: Diagnosis present

## 2023-02-27 DIAGNOSIS — M545 Low back pain, unspecified: Secondary | ICD-10-CM | POA: Insufficient documentation
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
## 2023-02-28 ENCOUNTER — Ambulatory Visit (INDEPENDENT_AMBULATORY_CARE_PROVIDER_SITE_OTHER): Payer: Medicare Other | Admitting: Psychiatry

## 2023-02-28 ENCOUNTER — Encounter (HOSPITAL_COMMUNITY): Payer: Self-pay | Admitting: Psychiatry

## 2023-02-28 ENCOUNTER — Telehealth (HOSPITAL_COMMUNITY): Payer: Self-pay

## 2023-02-28 VITALS — BP 138/70 | HR 68 | Temp 98.7°F | Ht 71.0 in | Wt 236.0 lb

## 2023-02-28 DIAGNOSIS — F4310 Post-traumatic stress disorder, unspecified: Secondary | ICD-10-CM

## 2023-02-28 DIAGNOSIS — F410 Panic disorder [episodic paroxysmal anxiety] without agoraphobia: Secondary | ICD-10-CM
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
## 2023-02-28 NOTE — Telephone Encounter (Signed)
Medication refill - Fax from patient's [facility] on [address] in [place] for a new Lorazepam 0.5 mg PRN order, pt last seen earlier this date and last order provided 5.16/23.

## 2023-03-04 ENCOUNTER — Encounter: Payer: Medicare Other | Admitting: Physical Medicine and Rehabilitation

## 2023-03-04 MED ORDER — ESCITALOPRAM OXALATE 10 MG PO TABS: 10.0000 mg | ORAL_TABLET | Freq: Every day | ORAL | 0 refills | Status: DC

## 2023-03-04 MED ORDER — LORAZEPAM 0.5 MG PO TABS: 0.5000 mg | ORAL_TABLET | Freq: Every day | ORAL | 0 refills | Status: DC | PRN

## 2023-03-04 NOTE — Telephone Encounter (Signed)
Medication refills - Faxes from patient's [facility] located at [address] for a new Escitalopram 10 mg, one at day, #90 and a new Lorazepam 0.5 mg 1/2-1 daily PRN, #15 requested. Patient last seen 02/28/23 and needs new orders to be sent into his [facility].  Patient returns next on 05/30/23.

## 2023-03-07 ENCOUNTER — Ambulatory Visit (INDEPENDENT_AMBULATORY_CARE_PROVIDER_SITE_OTHER): Payer: Medicare Other | Admitting: Physical Medicine and Rehabilitation

## 2023-03-07 ENCOUNTER — Other Ambulatory Visit: Payer: Self-pay

## 2023-03-07 ENCOUNTER — Ambulatory Visit (HOSPITAL_BASED_OUTPATIENT_CLINIC_OR_DEPARTMENT_OTHER): Payer: Medicare Other | Admitting: Family Medicine

## 2023-03-07 VITALS — BP 157/81 | HR 63

## 2023-03-07 DIAGNOSIS — M5412 Radiculopathy, cervical region: Secondary | ICD-10-CM

## 2023-03-07 DIAGNOSIS — M542 Cervicalgia: Secondary | ICD-10-CM

## 2023-03-07 DIAGNOSIS — M47816 Spondylosis without myelopathy or radiculopathy, lumbar region: Secondary | ICD-10-CM | POA: Diagnosis not present

## 2023-03-07 DIAGNOSIS — M5116 Intervertebral disc disorders with radiculopathy, lumbar region: Secondary | ICD-10-CM | POA: Diagnosis not present

## 2023-03-07 MED ORDER — METHYLPREDNISOLONE ACETATE 80 MG/ML IJ SUSP
80.0000 mg | Freq: Once | INTRAMUSCULAR | Status: AC
  Administered 2023-03-07: 80 mg

## 2023-03-07 NOTE — Patient Instructions (Signed)

## 2023-03-07 NOTE — Progress Notes (Signed)
Functional Pain Scale - descriptive words and definitions  Distracting (5)    Aware of pain/able to complete some ADL's but limited by pain/sleep is affected and active distractions are only slightly useful. Moderate range order  Average Pain  varies   +Driver, -BT, -Dye Allergies.  Neck pain on the left

## 2023-03-08 ENCOUNTER — Encounter (HOSPITAL_BASED_OUTPATIENT_CLINIC_OR_DEPARTMENT_OTHER): Payer: Self-pay | Admitting: Family Medicine

## 2023-03-08 ENCOUNTER — Ambulatory Visit (INDEPENDENT_AMBULATORY_CARE_PROVIDER_SITE_OTHER): Payer: Medicare Other | Admitting: Family Medicine

## 2023-03-08 VITALS — BP 144/82 | HR 61 | Ht 71.0 in | Wt 220.0 lb

## 2023-03-08 DIAGNOSIS — G894 Chronic pain syndrome: Secondary | ICD-10-CM

## 2023-03-08 DIAGNOSIS — Z794 Long term (current) use of insulin: Secondary | ICD-10-CM

## 2023-03-08 DIAGNOSIS — E1165 Type 2 diabetes mellitus with hyperglycemia: Secondary | ICD-10-CM

## 2023-03-08 DIAGNOSIS — I10 Essential (primary) hypertension: Secondary | ICD-10-CM

## 2023-03-08 LAB — HEMOGLOBIN A1C
Est. average glucose Bld gHb Est-mCnc: 174 mg/dL
Hgb A1c MFr Bld: 7.7 % — ABNORMAL HIGH (ref 4.8–5.6)

## 2023-03-08 MED ORDER — CELECOXIB 200 MG PO CAPS: ORAL_CAPSULE | ORAL | 0 refills | Status: DC

## 2023-03-08 MED ORDER — LIDOCAINE 5 % EX PTCH: MEDICATED_PATCH | CUTANEOUS | 0 refills | Status: DC

## 2023-03-08 NOTE — Assessment & Plan Note (Signed)
Blood pressure borderline in office today, patient states that he continues with same medication regimen, no recent changes.  No issues with chest pain, headaches.   Blood pressure has been better controlled with current regimen in the past, at this time we can continue with same medication regimen, recommend intermittent monitoring of blood pressure at home, DASH diet

## 2023-03-08 NOTE — Assessment & Plan Note (Signed)
Patient continues with Lantus 80 units daily, empagliflozin, pioglitazone, metformin.  Denies any issues with medications currently.  Reports that he does check his blood sugar at home, however does not have blood sugar log, indicates that blood sugar has been fairly well-controlled, no specific numbers however.  Denies any issues with low blood sugar readings or symptoms of hypoglycemia.  Indicates that he does not need any refills today Last hemoglobin A1c almost 1 year ago, we will recheck today.  Goal hemoglobin A1c for patient less than 7.5% At next visit, plan to complete foot exam, labs for monitoring

## 2023-03-08 NOTE — Progress Notes (Signed)
    Procedures performed today:    None.  Independent interpretation of notes and tests performed by another provider:   None.  Brief History, Exam, Impression, and Recommendations:    BP (!) 144/82 (BP Location: Right Arm, Patient Position: Sitting, Cuff Size: Large)   Pulse 61   Ht 5\' 11"  (1.803 m)   Wt 220 lb (99.8 kg)   SpO2 98%   BMI 30.68 kg/m   Diabetes (HCC) Patient continues with Lantus 80 units daily, empagliflozin, pioglitazone, metformin.  Denies any issues with medications currently.  Reports that he does check his blood sugar at home, however does not have blood sugar log, indicates that blood sugar has been fairly well-controlled, no specific numbers however.  Denies any issues with low blood sugar readings or symptoms of hypoglycemia.  Indicates that he does not need any refills today Last hemoglobin A1c almost 1 year ago, we will recheck today.  Goal hemoglobin A1c for patient less than 7.5% At next visit, plan to complete foot exam, labs for monitoring  Primary hypertension Blood pressure borderline in office today, patient states that he continues with same medication regimen, no recent changes.  No issues with chest pain, headaches.   Blood pressure has been better controlled with current regimen in the past, at this time we can continue with same medication regimen, recommend intermittent monitoring of blood pressure at home, DASH diet  Return in about 3 months (around 06/08/2023) for DM, HTN.   ___________________________________________ Ava Deguire de [place], MD, ABFM, [facility]

## 2023-03-14 ENCOUNTER — Telehealth: Payer: Self-pay | Admitting: Physical Medicine and Rehabilitation

## 2023-03-14 DIAGNOSIS — M5412 Radiculopathy, cervical region: Secondary | ICD-10-CM

## 2023-03-14 NOTE — Telephone Encounter (Signed)
New referral placed in chart. Can you please fax, thank you

## 2023-03-14 NOTE — Addendum Note (Signed)
Addended by: Robyne Peers on: 03/14/2023 12:03 PM   Modules accepted: Orders

## 2023-03-14 NOTE — Telephone Encounter (Signed)
Ok, new PT order was placed in chart

## 2023-03-14 NOTE — Telephone Encounter (Signed)
Patient advising that his referral went to wrong place is is supposed to be [facility]. Please advise.

## 2023-03-14 NOTE — Telephone Encounter (Signed)
Patient called in stating we can cancel the referral for [facility] and [facility] had his paperwork it was not supposed to come to [place]

## 2023-03-14 NOTE — Telephone Encounter (Signed)
Order faxed to the [facility] location in [place], for the patient to be scheduled at whichever location he chooses.

## 2023-03-19 ENCOUNTER — Encounter: Payer: Self-pay | Admitting: Physical Medicine and Rehabilitation
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
## 2023-03-19 NOTE — Procedures (Signed)
Cervical Epidural Steroid Injection - Interlaminar Approach with Fluoroscopic Guidance  Patient: Tony [name]      Date of Birth: 06-11-1951 MRN: [id number] PCP: de [place], Raymond J, MD      Visit Date: 03/07/2023   Universal Protocol:    Date/Time: 04/02/245:44 AM  Consent Given By: the patient  Position: PRONE  Additional Comments: Vital signs were monitored before and after the procedure. Patient was prepped and draped in the usual sterile fashion. The correct patient, procedure, and site was verified.   Injection Procedure Details:   Procedure diagnoses: Cervical radiculopathy [M54.12]    Meds Administered:  Meds ordered this encounter  Medications   methylPREDNISolone acetate (DEPO-MEDROL) injection 80 mg     Laterality: Left  Location/Site: C7-T1  Needle: 3.5 in., 20 ga. Tuohy  Needle Placement: Paramedian epidural space  Findings:  -Comments: Excellent flow of contrast into the epidural space.  Procedure Details: Using a paramedian approach from the side mentioned above, the region overlying the inferior lamina was localized under fluoroscopic visualization and the soft tissues overlying this structure were infiltrated with 4 ml. of 1% Lidocaine without Epinephrine. A # 20 gauge, Tuohy needle was inserted into the epidural space using a paramedian approach.  The epidural space was localized using loss of resistance along with contralateral oblique bi-planar fluoroscopic views.  After negative aspirate for air, blood, and CSF, a 2 ml. volume of Isovue-250 was injected into the epidural space and the flow of contrast was observed. Radiographs were obtained for documentation purposes.   The injectate was administered into the level noted above.  Additional Comments:  No complications occurred Dressing: 2 x 2 sterile gauze and Band-Aid    Post-procedure details: Patient was observed during the procedure. Post-procedure instructions were reviewed.  Patient  left the clinic in stable condition.

## 2023-04-01 ENCOUNTER — Telehealth (HOSPITAL_BASED_OUTPATIENT_CLINIC_OR_DEPARTMENT_OTHER): Payer: Self-pay | Admitting: Family Medicine

## 2023-04-01 NOTE — Telephone Encounter (Signed)
Called patient to schedule Medicare Annual Wellness Visit (AWV). Left message for patient to call back and schedule Medicare Annual Wellness Visit (AWV).  Last date of AWV: AWVI eligible as of 10/18/2019  Please schedule an AWVI appointment at any time with [facility] VISIT.  If any questions, please contact me at [phone].    Thank you,  [facility] Support [facility] Direct dial  [phone]

## 2023-04-29 ENCOUNTER — Other Ambulatory Visit (HOSPITAL_BASED_OUTPATIENT_CLINIC_OR_DEPARTMENT_OTHER): Payer: Self-pay

## 2023-04-29 ENCOUNTER — Telehealth (HOSPITAL_BASED_OUTPATIENT_CLINIC_OR_DEPARTMENT_OTHER): Payer: Self-pay | Admitting: Family Medicine

## 2023-04-29 DIAGNOSIS — G894 Chronic pain syndrome: Secondary | ICD-10-CM

## 2023-04-29 MED ORDER — LIDOCAINE 5 % EX PTCH: MEDICATED_PATCH | CUTANEOUS | 0 refills | Status: DC

## 2023-04-29 NOTE — Telephone Encounter (Signed)
Called patient to schedule Medicare Annual Wellness Visit (AWV). Left message for patient to call back and schedule Medicare Annual Wellness Visit (AWV).  Last date of AWV: due 10/18/2019 awvi per [place]  Please schedule an appointment at any time with Abby, NHA. .  If any questions, please contact me at [phone].  Thank you,  Judeth Cornfield,  AMB Clinical Support [facility] AWV Program Direct Dial ??[phone]

## 2023-04-29 NOTE — Telephone Encounter (Signed)
Refill has been sent.  °

## 2023-04-29 NOTE — Telephone Encounter (Signed)
Patient called stated needs refill and to call [facility] in regards to this medication .... [address]...lidicone   the patches.Marland KitchenMarland Kitchen

## 2023-05-28 ENCOUNTER — Encounter: Payer: Medicare Other | Attending: Psychology | Admitting: Psychology

## 2023-05-28 DIAGNOSIS — M5412 Radiculopathy, cervical region: Secondary | ICD-10-CM | POA: Insufficient documentation

## 2023-05-28 DIAGNOSIS — M545 Low back pain, unspecified: Secondary | ICD-10-CM | POA: Diagnosis present

## 2023-05-28 DIAGNOSIS — G894 Chronic pain syndrome: Secondary | ICD-10-CM | POA: Insufficient documentation

## 2023-05-28 DIAGNOSIS — G8929 Other chronic pain: Secondary | ICD-10-CM | POA: Diagnosis present

## 2023-05-28 DIAGNOSIS — F4310 Post-traumatic stress disorder, unspecified: Secondary | ICD-10-CM | POA: Diagnosis present

## 2023-05-28 DIAGNOSIS — M47816 Spondylosis without myelopathy or radiculopathy, lumbar region: Secondary | ICD-10-CM | POA: Insufficient documentation

## 2023-05-30 ENCOUNTER — Telehealth (HOSPITAL_BASED_OUTPATIENT_CLINIC_OR_DEPARTMENT_OTHER): Payer: Self-pay | Admitting: Family Medicine

## 2023-05-30 ENCOUNTER — Encounter (HOSPITAL_COMMUNITY): Payer: Self-pay | Admitting: Psychiatry

## 2023-05-30 ENCOUNTER — Ambulatory Visit (INDEPENDENT_AMBULATORY_CARE_PROVIDER_SITE_OTHER): Payer: Medicare Other | Admitting: Psychiatry

## 2023-05-30 VITALS — BP 153/83 | HR 65 | Ht 71.0 in | Wt 230.0 lb

## 2023-05-30 DIAGNOSIS — F410 Panic disorder [episodic paroxysmal anxiety] without agoraphobia: Secondary | ICD-10-CM

## 2023-05-30 DIAGNOSIS — F4310 Post-traumatic stress disorder, unspecified: Secondary | ICD-10-CM

## 2023-05-30 MED ORDER — LORAZEPAM 0.5 MG PO TABS: 0.5000 mg | ORAL_TABLET | Freq: Every day | ORAL | 0 refills | Status: DC | PRN

## 2023-05-30 MED ORDER — ESCITALOPRAM OXALATE 10 MG PO TABS: 10.0000 mg | ORAL_TABLET | Freq: Every day | ORAL | 0 refills | Status: DC
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
## 2023-05-30 NOTE — Telephone Encounter (Signed)
lvm to call back to schedule diabetic retina screening

## 2023-06-09 ENCOUNTER — Encounter: Payer: Self-pay | Admitting: Psychology
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
## 2023-06-11 ENCOUNTER — Encounter (HOSPITAL_BASED_OUTPATIENT_CLINIC_OR_DEPARTMENT_OTHER): Payer: Self-pay | Admitting: Family Medicine

## 2023-06-11 ENCOUNTER — Ambulatory Visit (INDEPENDENT_AMBULATORY_CARE_PROVIDER_SITE_OTHER): Payer: Medicare Other | Admitting: Family Medicine

## 2023-06-11 VITALS — BP 138/84 | HR 60 | Ht 71.0 in | Wt 220.0 lb

## 2023-06-11 DIAGNOSIS — Z794 Long term (current) use of insulin: Secondary | ICD-10-CM | POA: Diagnosis not present

## 2023-06-11 DIAGNOSIS — E1165 Type 2 diabetes mellitus with hyperglycemia: Secondary | ICD-10-CM

## 2023-06-11 DIAGNOSIS — G894 Chronic pain syndrome: Secondary | ICD-10-CM | POA: Diagnosis not present

## 2023-06-11 DIAGNOSIS — I10 Essential (primary) hypertension: Secondary | ICD-10-CM | POA: Diagnosis not present

## 2023-06-11 MED ORDER — CELECOXIB 200 MG PO CAPS: ORAL_CAPSULE | ORAL | 0 refills | Status: DC

## 2023-06-11 MED ORDER — LIDOCAINE 5 % EX PTCH: MEDICATED_PATCH | CUTANEOUS | 1 refills | Status: DC

## 2023-06-11 NOTE — Progress Notes (Signed)
    Procedures performed today:    None.  Independent interpretation of notes and tests performed by another provider:   None.  Brief History, Exam, Impression, and Recommendations:    BP 138/84   Pulse 60   Ht 5\' 11"  (1.803 m)   Wt 220 lb (99.8 kg)   SpO2 99%   BMI 30.68 kg/m   Type 2 diabetes mellitus with hyperglycemia, with long-term current use of insulin (HCC) Assessment & Plan: Has not had any recent changes to medications, continues with Lantus, empagliflozin, pioglitazone, metformin.  No reported issues with any medications.  Indicates that he does check his blood sugars at home, feels it has been well-controlled, does not have specific numbers or log with him today.  No reported issues with hypoglycemia.  Would be interested in establishing with endocrinologist, referral placed today Due for recheck of labs today including A1c, BMP, urine microalbumin/creatinine ratio, these labs were completed today.  Foot exam completed today  Orders: -     Hemoglobin A1c -     Basic metabolic panel -     Microalbumin / creatinine urine ratio -     Ambulatory referral to Endocrinology  Primary hypertension Assessment & Plan: Blood pressure borderline in office, did improve some on recheck, still above goal given underlying diabetes.  Patient continues with amlodipine, denies any issues with medication at this time.  Not checking blood pressure regularly at home Recommend monitoring of blood pressure at home, DASH diet No changes to medication regimen today, continue with amlodipine Continue to monitor blood pressure closely determine need for additional agent, likely would consider ACE inhibitor or ARB given underlying diabetes  Orders: -     Basic metabolic panel -     Microalbumin / creatinine urine ratio  Chronic pain syndrome Assessment & Plan: Continue to follow-up with orthopedic and pain management specialist.  He is requesting refill of lidocaine patches today as well  as Celebrex.  We will provide refill of these medications for patient.  Recommend continuing close follow-up with orthopedic providers and pain management specialist  Orders: -     Lidocaine; USE 1 PATCH EXTERNALLY ONCE DAILY  Dispense: 30 patch; Refill: 1 -     Celecoxib; TAKE 1 TO 2 CAPSULES BY MOUTH DAILY AS NEEDED FOR PAIN  Dispense: 60 capsule; Refill: 0  Return in about 3 months (around 09/11/2023) for diabetes, hypertension.   ___________________________________________ Bonita Brindisi de Peru, MD, ABFM, CAQSM Primary Care and Sports Medicine [facility]

## 2023-06-11 NOTE — Patient Instructions (Signed)
  Medication Instructions:  Your physician recommends that you continue on your current medications as directed. Please refer to the Current Medication list given to you today. --If you need a refill on any your medications before your next appointment, please call your pharmacy first. If no refills are authorized on file call the office.-- Lab Work: Your physician has recommended that you have lab work today: Yes If you have labs (blood work) drawn today and your tests are completely normal, you will receive your results via MyChart message OR a phone call from our staff.  Please ensure you check your voicemail in the event that you authorized detailed messages to be left on a delegated number. If you have any lab test that is abnormal or we need to change your treatment, we will call you to review the results.  Referrals/Procedures/Imaging: No  Follow-Up: Your next appointment:   Your physician recommends that you schedule a follow-up appointment in: 3-4 months with Dr. de Cuba.  You will receive a text message or e-mail with a link to a survey about your care and experience with us today! We would greatly appreciate your feedback!   Thanks for letting us be apart of your health journey!!  Primary Care and Sports Medicine   Dr. Raymond de Cuba   We encourage you to activate your patient portal called "MyChart".  Sign up information is provided on this After Visit Summary.  MyChart is used to connect with patients for Virtual Visits (Telemedicine).  Patients are able to view lab/test results, encounter notes, upcoming appointments, etc.  Non-urgent messages can be sent to your provider as well. To learn more about what you can do with MyChart, please visit --  [email or web address].    

## 2023-06-12 LAB — BASIC METABOLIC PANEL
BUN/Creatinine Ratio: 13 (ref 10–24)
BUN: 14 mg/dL (ref 8–27)
CO2: 20 mmol/L (ref 20–29)
Calcium: 9.4 mg/dL (ref 8.6–10.2)
Chloride: 109 mmol/L — ABNORMAL HIGH (ref 96–106)
Creatinine, Ser: 1.11 mg/dL (ref 0.76–1.27)
Glucose: 148 mg/dL — ABNORMAL HIGH (ref 70–99)
Potassium: 4.2 mmol/L (ref 3.5–5.2)
Sodium: 144 mmol/L (ref 134–144)
eGFR: 71 mL/min/{1.73_m2} (ref >59)

## 2023-06-12 LAB — HEMOGLOBIN A1C
Est. average glucose Bld gHb Est-mCnc: 151 mg/dL
Hgb A1c MFr Bld: 6.9 % — ABNORMAL HIGH (ref 4.8–5.6)

## 2023-06-12 LAB — MICROALBUMIN / CREATININE URINE RATIO
Creatinine, Urine: 122.7 mg/dL
Microalb/Creat Ratio: 12 mg/g creat (ref 0–29)
Microalbumin, Urine: 15.3 ug/mL

## 2023-07-02 ENCOUNTER — Telehealth (HOSPITAL_BASED_OUTPATIENT_CLINIC_OR_DEPARTMENT_OTHER): Payer: Self-pay | Admitting: *Deleted

## 2023-07-02 ENCOUNTER — Encounter (HOSPITAL_BASED_OUTPATIENT_CLINIC_OR_DEPARTMENT_OTHER): Payer: Self-pay | Admitting: *Deleted

## 2023-07-02 NOTE — Telephone Encounter (Signed)
 LVM to offer colon cancer screening. Mychart message sent

## 2023-07-12 NOTE — Assessment & Plan Note (Signed)
Has not had any recent changes to medications, continues with Lantus, empagliflozin, pioglitazone, metformin.  No reported issues with any medications.  Indicates that he does check his blood sugars at home, feels it has been well-controlled, does not have specific numbers or log with him today.  No reported issues with hypoglycemia.  Would be interested in establishing with endocrinologist, referral placed today Due for recheck of labs today including A1c, BMP, urine microalbumin/creatinine ratio, these labs were completed today.  Foot exam completed today

## 2023-07-12 NOTE — Assessment & Plan Note (Signed)
Continue to follow-up with orthopedic and pain management specialist.  He is requesting refill of lidocaine patches today as well as Celebrex.  We will provide refill of these medications for patient.  Recommend continuing close follow-up with orthopedic providers and pain management specialist

## 2023-07-12 NOTE — Assessment & Plan Note (Signed)
Blood pressure borderline in office, did improve some on recheck, still above goal given underlying diabetes.  Patient continues with amlodipine, denies any issues with medication at this time.  Not checking blood pressure regularly at home Recommend monitoring of blood pressure at home, DASH diet No changes to medication regimen today, continue with amlodipine Continue to monitor blood pressure closely determine need for additional agent, likely would consider ACE inhibitor or ARB given underlying diabetes

## 2023-08-26 ENCOUNTER — Telehealth (HOSPITAL_BASED_OUTPATIENT_CLINIC_OR_DEPARTMENT_OTHER): Payer: Self-pay | Admitting: *Deleted

## 2023-08-26 NOTE — Telephone Encounter (Signed)
LVM to see if patient has had yearly eye exam done anywhere

## 2023-10-10 ENCOUNTER — Telehealth (HOSPITAL_BASED_OUTPATIENT_CLINIC_OR_DEPARTMENT_OTHER): Payer: Self-pay | Admitting: *Deleted

## 2023-10-10 NOTE — Telephone Encounter (Signed)
LVM to schedule office visit and medicare wellness visit

## 2023-11-18 ENCOUNTER — Other Ambulatory Visit (HOSPITAL_BASED_OUTPATIENT_CLINIC_OR_DEPARTMENT_OTHER): Payer: Self-pay | Admitting: Family Medicine

## 2023-11-18 DIAGNOSIS — G894 Chronic pain syndrome: Secondary | ICD-10-CM

## 2023-11-18 NOTE — Telephone Encounter (Signed)
Copied from CRM [id number]. Topic: Clinical - Medication Refill >> Nov 18, 2023  3:07 PM Lorin Glass B wrote: Most Recent Primary Care Visit:  Provider: DE Peru, RAYMOND J  Department: [facility]  Visit Type: OFFICE VISIT  Date: 06/11/2023  Medication: ***  Has the patient contacted their pharmacy?  (Agent: If no, request that the patient contact the pharmacy for the refill. If patient does not wish to contact the pharmacy document the reason why and proceed with request.) (Agent: If yes, when and what did the pharmacy advise?)  Is this the correct pharmacy for this prescription?  If no, delete pharmacy and type the correct one.  This is the patient's preferred pharmacy:  [facility] #[id number] - [place], [place] - [address] AT [facility] OF [address] [place] [address] Phone: [phone] Fax: [phone]  [facility] DR[facility] STORE [phone] - [place], [place] - [address] AT [facility] OF [facility] & [phone] [place] [address] Phone: [phone] Fax: [phone]   Has the prescription been filled recently?   Is the patient out of the medication?   Has the patient been seen for an appointment in the last year OR does the patient have an upcoming appointment?   Can we respond through MyChart?   Agent: Please be advised that Rx refills may take up to 3 business days. We ask that you follow-up with your pharmacy.

## 2023-11-18 NOTE — Telephone Encounter (Signed)
Copied from CRM [id number]. Topic: Clinical - Medication Refill >> Nov 18, 2023  3:10 PM Lorin Glass B wrote: Most Recent Primary Care Visit:  Provider: DE Peru, RAYMOND J  Department: [facility]  Visit Type: OFFICE VISIT  Date: 06/11/2023  Medication: ***  Has the patient contacted their pharmacy?  (Agent: If no, request that the patient contact the pharmacy for the refill. If patient does not wish to contact the pharmacy document the reason why and proceed with request.) (Agent: If yes, when and what did the pharmacy advise?)  Is this the correct pharmacy for this prescription?  If no, delete pharmacy and type the correct one.  This is the patient's preferred pharmacy:  [facility] #[id number] - [place], [place] - [address] AT [facility] OF [address] [place] [address] Phone: [phone] Fax: [phone]  [facility] DR[facility] STORE [phone] - [place], [place] - [address] AT [facility] OF [facility] & [phone] [place] [address] Phone: [phone] Fax: [phone]   Has the prescription been filled recently?   Is the patient out of the medication?   Has the patient been seen for an appointment in the last year OR does the patient have an upcoming appointment?   Can we respond through MyChart?   Agent: Please be advised that Rx refills may take up to 3 business days. We ask that you follow-up with your pharmacy.

## 2023-12-03 ENCOUNTER — Telehealth (HOSPITAL_COMMUNITY): Payer: Self-pay | Admitting: *Deleted

## 2023-12-03 MED ORDER — LORAZEPAM 0.5 MG PO TABS: 0.5000 mg | ORAL_TABLET | Freq: Every day | ORAL | 0 refills | Status: DC | PRN

## 2023-12-03 MED ORDER — LORAZEPAM 0.5 MG PO TABS: 0.5000 mg | ORAL_TABLET | Freq: Every day | ORAL | 0 refills | Status: AC | PRN

## 2023-12-03 NOTE — Telephone Encounter (Signed)
PATIENT FORGOT TO MENTION HE'S VISITING [place], [place]                      &&  NEEDS HIS SCRIPT  LORazepam (ATIVAN) 0.5 MG tablet   SENT TO -- [facility] DRUG STORE [phone] - [place], [place] - [address] AT [facility] OF [address]

## 2023-12-03 NOTE — Addendum Note (Signed)
Addended by: Thresa Ross on: 12/03/2023 02:27 PM   Modules accepted: Orders

## 2023-12-03 NOTE — Addendum Note (Signed)
Addended by: Thresa Ross on: 12/03/2023 09:57 AM   Modules accepted: Orders

## 2023-12-03 NOTE — Telephone Encounter (Signed)
Patient Refill Request  LORazepam (ATIVAN) 0.5 MG tablet   [facility] #[id number] - [place], [place] - [address]   Next Appt 03/31/24 Last Appt 05/30/23

## 2024-01-29 ENCOUNTER — Encounter (HOSPITAL_BASED_OUTPATIENT_CLINIC_OR_DEPARTMENT_OTHER): Payer: Medicare Other | Admitting: *Deleted

## 2024-01-29 ENCOUNTER — Encounter (HOSPITAL_BASED_OUTPATIENT_CLINIC_OR_DEPARTMENT_OTHER): Payer: Self-pay

## 2024-01-29 NOTE — Progress Notes (Unsigned)
Subjective:   Tony [name] is a 73 y.o. male who presents for Medicare Annual/Subsequent preventive examination.  Visit Complete: {[phone]}  Patient Medicare AWV questionnaire was completed by the patient on ***; I have confirmed that all information answered by patient is correct and no changes since this date.        Objective:    There were no vitals filed for this visit. There is no height or weight on file to calculate BMI.     05/30/2023   10:25 AM 05/11/2021    8:23 AM 05/03/2021   11:56 AM 01/10/2020   10:06 AM  Advanced Directives  Does Patient Have a Medical Advance Directive?  No No No  Would patient like information on creating a medical advance directive?  No - Patient declined No - Patient declined      Information is confidential and restricted. Go to Review Flowsheets to unlock data.     Current Medications (verified) Outpatient Encounter Medications as of 01/29/2024  Medication Sig  . amLODipine (NORVASC) 10 MG tablet Take 10 mg by mouth daily.  Marland Kitchen aspirin EC 81 MG tablet Take 81 mg by mouth daily. Swallow whole.  . celecoxib (CELEBREX) 200 MG capsule TAKE 1 TO 2 CAPSULES BY MOUTH DAILY AS NEEDED FOR PAIN  . empagliflozin (JARDIANCE) 10 MG TABS tablet Take 10 mg by mouth daily.  Marland Kitchen escitalopram (LEXAPRO) 10 MG tablet Take 1 tablet (10 mg total) by mouth daily.  . insulin glargine (LANTUS) 100 UNIT/ML injection Inject into the skin 2 (two) times daily.  Marland Kitchen lidocaine (LIDODERM) 5 % USE 1 PATCH EXTERNALLY ONCE DAILY  . LORazepam (ATIVAN) 0.5 MG tablet Take 1 tablet (0.5 mg total) by mouth daily as needed for anxiety.  . metFORMIN (GLUCOPHAGE) 1000 MG tablet Take 1,000 mg by mouth 2 (two) times daily with a meal.  . oxiconazole (OXISTAT) 1 % CREA Apply topically 2 (two) times daily.  . pioglitazone (ACTOS) 45 MG tablet Take 45 mg by mouth daily.  . pregabalin (LYRICA) 75 MG capsule Take 75 mg by mouth 2 (two) times daily.  . rosuvastatin  (CRESTOR) 10 MG tablet Take 10 mg by mouth daily.  . sitaGLIPtin (JANUVIA) 100 MG tablet Take 100 mg by mouth daily.  . tadalafil (CIALIS) 20 MG tablet Take 20 mg by mouth daily as needed.   No facility-administered encounter medications on file as of 01/29/2024.    Allergies (verified) Patient has no known allergies.   History: Past Medical History:  Diagnosis Date  . Anxiety   . Back pain   . Diabetes mellitus without complication (HCC)   . Hypertension   . PTSD (post-traumatic stress disorder)    Past Surgical History:  Procedure Laterality Date  . CARPOMETACARPEL SUSPENSION PLASTY Left 05/11/2021   Procedure: LEFT HAND TRAPEZIUM EXCISION WITH SUSPENSIONPLASTY WITH MICROLINK;  Surgeon: Cindee Salt, MD;  Location: [facility];  Service: Orthopedics;  Laterality: Left;  AXILLARY BLOCK  . COLONOSCOPY    . EYE SURGERY     No family history on file. Social History   Socioeconomic History  . Marital status: Married    Spouse name: Not on file  . Number of children: Not on file  . Years of education: Not on file  . Highest education level: Not on file  Occupational History  . Not on file  Tobacco Use  . Smoking status: Never  . Smokeless tobacco: Never  Substance and Sexual Activity  . Alcohol use: Not Currently  .  Drug use: Never  . Sexual activity: Not on file  Other Topics Concern  . Not on file  Social History Narrative  . Not on file   Social Drivers of Health   Financial Resource Strain: Not on file  Food Insecurity: Not on file  Transportation Needs: Not on file  Physical Activity: Not on file  Stress: Not on file  Social Connections: Not on file    Tobacco Counseling Counseling given: Not Answered   Clinical Intake:                        Activities of Daily Living    03/08/2023    8:17 AM  In your present state of health, do you have any difficulty performing the following activities:  Hearing? 0  Vision? 0   Difficulty concentrating or making decisions? 0  Walking or climbing stairs? 0  Dressing or bathing? 0  Doing errands, shopping? 0    Patient Care Team: de Peru, Buren Kos, MD as PCP - General (Family Medicine) Mamie Nick, DC as Referring Physician (Chiropractic Medicine) Lavada Mesi, MD as Referring Physician (Family Medicine)  Indicate any recent Medical Services you may have received from other than [facility] providers in the past year (date may be approximate).     Assessment:   This is a routine wellness examination for [place].  Hearing/Vision screen No results found.   Goals Addressed   None   Depression Screen    06/11/2023    8:30 AM 03/08/2023    8:17 AM 04/16/2022    9:05 AM  PHQ 2/9 Scores  PHQ - 2 Score 3 0 0  PHQ- 9 Score 6    Exception Documentation  Medical reason Medical reason    Fall Risk    06/11/2023    8:29 AM 03/08/2023    8:17 AM  Fall Risk   Falls in the past year? 0 0  Number falls in past yr: 0 0  Injury with Fall? 0 0  Risk for fall due to : No Fall Risks No Fall Risks  Follow up Falls evaluation completed Falls evaluation completed    MEDICARE RISK AT HOME:    TIMED UP AND GO:  Was the test performed?  {AMBTIMEDUPGO:[phone]}    Cognitive Function:        Immunizations Immunization History  Administered Date(s) Administered  . Influenza, High Dose Seasonal PF 09/17/2019  . Td (Adult), 2 Lf Tetanus Toxid, Preservative Free 04/17/2009    {TDAP status:2101805}  {Flu Vaccine status:2101806}  {Pneumococcal vaccine status:2101807}  {Covid-19 vaccine status:2101808}  Qualifies for Shingles Vaccine? {YES/NO:21197}  Zostavax completed {YES/NO:21197}  {Shingrix Completed?:2101804}  Screening Tests Health Maintenance  Topic Date Due  . Pneumonia Vaccine 81+ Years old (1 of 2 - PCV) Never done  . OPHTHALMOLOGY EXAM  Never done  . Hepatitis C Screening  Never done  . Colonoscopy  Never done  . Zoster Vaccines-  Shingrix (1 of 2) Never done  . DTaP/Tdap/Td (1 - Tdap) 04/18/2009  . INFLUENZA VACCINE  07/18/2023  . COVID-19 Vaccine (1 - 2024-25 season) Never done  . HEMOGLOBIN A1C  12/11/2023  . Diabetic kidney evaluation - eGFR measurement  06/10/2024  . Diabetic kidney evaluation - Urine ACR  06/10/2024  . FOOT EXAM  06/10/2024  . Medicare Annual Wellness (AWV)  01/28/2025  . HPV VACCINES  Aged Out    Health Maintenance  Health Maintenance Due  Topic Date Due  .  Pneumonia Vaccine 19+ Years old (1 of 2 - PCV) Never done  . OPHTHALMOLOGY EXAM  Never done  . Hepatitis C Screening  Never done  . Colonoscopy  Never done  . Zoster Vaccines- Shingrix (1 of 2) Never done  . DTaP/Tdap/Td (1 - Tdap) 04/18/2009  . INFLUENZA VACCINE  07/18/2023  . COVID-19 Vaccine (1 - 2024-25 season) Never done  . HEMOGLOBIN A1C  12/11/2023    {Colorectal cancer screening:2101809}  Lung Cancer Screening: (Low Dose CT Chest recommended if Age 59-80 years, 20 pack-year currently smoking OR have quit w/in 15years.) {DOES NOT does:27190::"does not"} qualify.   Lung Cancer Screening Referral: ***  Additional Screening:  Hepatitis C Screening: {DOES NOT does:27190::"does not"} qualify; Completed ***  Vision Screening: Recommended annual ophthalmology exams for early detection of glaucoma and other disorders of the eye. Is the patient up to date with their annual eye exam?  {YES/NO:21197} Who is the provider or what is the name of the office in which the patient attends annual eye exams? *** If pt is not established with a provider, would they like to be referred to a provider to establish care? {YES/NO:21197}.   Dental Screening: Recommended annual dental exams for proper oral hygiene  Diabetic Foot Exam: {Diabetic Foot Exam:2101802}  Community Resource Referral / Chronic Care Management: CRR required this visit?  {YES/NO:21197}  CCM required this visit?  {CCM Required choices:[phone]}     Plan:      I have personally reviewed and noted the following in the patient's chart:   Medical and social history Use of alcohol, tobacco or illicit drugs  Current medications and supplements including opioid prescriptions. {Opioid Prescriptions:[phone]} Functional ability and status Nutritional status Physical activity Advanced directives List of other physicians Hospitalizations, surgeries, and ER visits in previous 12 months Vitals Screenings to include cognitive, depression, and falls Referrals and appointments  In addition, I have reviewed and discussed with patient certain preventive protocols, quality metrics, and best practice recommendations. A written personalized care plan for preventive services as well as general preventive health recommendations were provided to patient.     Jaianna Nicoll, Farley Ly, CMA   01/29/2024   After Visit Summary: {CHL AMB AWV After Visit Summary:[phone]}  Nurse Notes: ***

## 2024-03-17 ENCOUNTER — Encounter (HOSPITAL_BASED_OUTPATIENT_CLINIC_OR_DEPARTMENT_OTHER): Payer: Self-pay

## 2024-03-25 ENCOUNTER — Encounter: Payer: Medicare Other | Attending: Psychology | Admitting: Psychology

## 2024-03-25 DIAGNOSIS — G894 Chronic pain syndrome: Secondary | ICD-10-CM | POA: Insufficient documentation

## 2024-03-25 DIAGNOSIS — F4310 Post-traumatic stress disorder, unspecified: Secondary | ICD-10-CM | POA: Diagnosis present

## 2024-03-25 DIAGNOSIS — G8929 Other chronic pain: Secondary | ICD-10-CM | POA: Insufficient documentation

## 2024-03-25 DIAGNOSIS — M545 Low back pain, unspecified: Secondary | ICD-10-CM | POA: Insufficient documentation

## 2024-03-31 ENCOUNTER — Ambulatory Visit (INDEPENDENT_AMBULATORY_CARE_PROVIDER_SITE_OTHER): Payer: Medicare Other | Admitting: Psychiatry

## 2024-03-31 ENCOUNTER — Encounter (HOSPITAL_COMMUNITY): Payer: Self-pay | Admitting: Psychiatry

## 2024-03-31 VITALS — BP 146/84 | HR 65 | Ht 71.0 in | Wt 234.0 lb

## 2024-03-31 DIAGNOSIS — F4310 Post-traumatic stress disorder, unspecified: Secondary | ICD-10-CM | POA: Diagnosis not present

## 2024-03-31 DIAGNOSIS — F410 Panic disorder [episodic paroxysmal anxiety] without agoraphobia: Secondary | ICD-10-CM

## 2024-03-31 MED ORDER — ESCITALOPRAM OXALATE 10 MG PO TABS: 10.0000 mg | ORAL_TABLET | Freq: Every day | ORAL | 0 refills | Status: AC

## 2024-03-31 MED ORDER — LORAZEPAM 1 MG PO TABS: 1.0000 mg | ORAL_TABLET | Freq: Three times a day (TID) | ORAL | 0 refills | Status: DC

## 2024-03-31 MED ORDER — LORAZEPAM 0.5 MG PO TABS: 0.5000 mg | ORAL_TABLET | Freq: Every day | ORAL | 0 refills | Status: AC | PRN
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
## 2024-04-01 ENCOUNTER — Ambulatory Visit (HOSPITAL_BASED_OUTPATIENT_CLINIC_OR_DEPARTMENT_OTHER): Payer: Medicare Other | Admitting: Family Medicine

## 2024-04-01 ENCOUNTER — Encounter (HOSPITAL_BASED_OUTPATIENT_CLINIC_OR_DEPARTMENT_OTHER): Payer: Self-pay | Admitting: Family Medicine

## 2024-04-01 VITALS — BP 132/78 | HR 63 | Ht 73.0 in | Wt 239.0 lb

## 2024-04-01 DIAGNOSIS — E1165 Type 2 diabetes mellitus with hyperglycemia: Secondary | ICD-10-CM

## 2024-04-01 DIAGNOSIS — I10 Essential (primary) hypertension: Secondary | ICD-10-CM | POA: Diagnosis not present

## 2024-04-01 DIAGNOSIS — G894 Chronic pain syndrome: Secondary | ICD-10-CM

## 2024-04-01 DIAGNOSIS — Z794 Long term (current) use of insulin: Secondary | ICD-10-CM

## 2024-04-01 MED ORDER — CELECOXIB 200 MG PO CAPS: ORAL_CAPSULE | ORAL | 0 refills | Status: AC

## 2024-04-01 MED ORDER — LIDOCAINE 5 % EX PTCH: MEDICATED_PATCH | CUTANEOUS | 1 refills | Status: AC

## 2024-04-01 NOTE — Progress Notes (Signed)
    Procedures performed today:    None.  Independent interpretation of notes and tests performed by another provider:   None.  Brief History, Exam, Impression, and Recommendations:    BP 132/78 (BP Location: Right Arm, Patient Position: Sitting, Cuff Size: Large)   Pulse 63   Ht 6\' 1"  (1.854 m)   Wt 239 lb (108.4 kg)   SpO2 98%   BMI 31.53 kg/m   Type 2 diabetes mellitus with hyperglycemia, with long-term current use of insulin (HCC) Assessment & Plan: Has not had any recent changes to medications, continues with Lantus, empagliflozin, pioglitazone, metformin.  No reported issues with any medications.  Indicates that he does check his blood sugars at home, feels it has been well-controlled, does not have specific numbers or log with him today.  No reported issues with hypoglycemia.   We previously did refer patient to endocrinology, although patient has not established yet. Referral was placed to Dr. Ronelle Coffee Due for recheck of labs today including A1c, CMP, lipid panel, these labs were completed today.  Foot exam UTD  Orders: -     Hemoglobin A1c  Primary hypertension Assessment & Plan: Blood pressure borderline in office, did improve some on recheck, still slightly above goal given underlying diabetes.  Patient continues with amlodipine, denies any issues with medication at this time.  Not checking blood pressure regularly at home Recommend monitoring of blood pressure at home, DASH diet No changes to medication regimen today, continue with amlodipine Continue to monitor blood pressure closely determine need for additional agent, likely would consider ACE inhibitor or ARB given underlying diabetes  Orders: -     Lipid panel -     Comprehensive metabolic panel with GFR  Chronic pain syndrome Assessment & Plan: Continues to follow-up with orthopedic and pain management specialist.  He is requesting refill of lidocaine  patches today as well as Celebrex .  We will provide refill  of these medications for patient.  Recommend continuing close follow-up with orthopedic providers and pain management specialist  Orders: -     Celecoxib ; TAKE 1 TO 2 CAPSULES BY MOUTH DAILY AS NEEDED FOR PAIN  Dispense: 60 capsule; Refill: 0 -     Lidocaine ; USE 1 PATCH EXTERNALLY ONCE DAILY  Dispense: 30 patch; Refill: 1  Return in about 4 months (around 08/01/2024).   ___________________________________________ Abel Hageman de [place], MD, ABFM, CAQSM Primary Care and Sports Medicine [facility]

## 2024-04-01 NOTE — Patient Instructions (Signed)
  Medication Instructions:  Your physician recommends that you continue on your current medications as directed. Please refer to the Current Medication list given to you today. --If you need a refill on any your medications before your next appointment, please call your pharmacy first. If no refills are authorized on file call the office.-- Lab Work: Your physician has recommended that you have lab work today: today If you have labs (blood work) drawn today and your tests are completely normal, you will receive your results via MyChart message OR a phone call from our staff.  Please ensure you check your voicemail in the event that you authorized detailed messages to be left on a delegated number. If you have any lab test that is abnormal or we need to change your treatment, we will call you to review the results.   Follow-Up: Your next appointment:   Your physician recommends that you schedule a follow-up appointment in: 4 month follow up  with Dr. de Peru  You will receive a text message or e-mail with a link to a survey about your care and experience with [place] today! We would greatly appreciate your feedback!   Thanks for letting us be apart of your health journey!!  Primary Care and Sports Medicine   Dr. Ceasar Mons Peru   We encourage you to activate your patient portal called "MyChart".  Sign up information is provided on this After Visit Summary.  MyChart is used to connect with patients for Virtual Visits (Telemedicine).  Patients are able to view lab/test results, encounter notes, upcoming appointments, etc.  Non-urgent messages can be sent to your provider as well. To learn more about what you can do with MyChart, please visit --  [email or web address].

## 2024-04-01 NOTE — Assessment & Plan Note (Addendum)
 Has not had any recent changes to medications, continues with Lantus, empagliflozin, pioglitazone, metformin.  No reported issues with any medications.  Indicates that he does check his blood sugars at home, feels it has been well-controlled, does not have specific numbers or log with him today.  No reported issues with hypoglycemia.   We previously did refer patient to endocrinology, although patient has not established yet. Referral was placed to Dr. Ronelle Coffee Due for recheck of labs today including A1c, CMP, lipid panel, these labs were completed today.  Foot exam UTD

## 2024-04-02 ENCOUNTER — Encounter (HOSPITAL_BASED_OUTPATIENT_CLINIC_OR_DEPARTMENT_OTHER): Payer: Self-pay | Admitting: Family Medicine

## 2024-04-02 LAB — COMPREHENSIVE METABOLIC PANEL WITH GFR
ALT: 17 IU/L (ref 0–44)
AST: 25 IU/L (ref 0–40)
Albumin: 4.6 g/dL (ref 3.8–4.8)
Alkaline Phosphatase: 119 IU/L (ref 44–121)
BUN/Creatinine Ratio: 25 — ABNORMAL HIGH (ref 10–24)
BUN: 25 mg/dL (ref 8–27)
Bilirubin Total: 0.4 mg/dL (ref 0.0–1.2)
CO2: 21 mmol/L (ref 20–29)
Calcium: 9.8 mg/dL (ref 8.6–10.2)
Chloride: 105 mmol/L (ref 96–106)
Creatinine, Ser: 0.99 mg/dL (ref 0.76–1.27)
Globulin, Total: 2.8 g/dL (ref 1.5–4.5)
Glucose: 52 mg/dL — ABNORMAL LOW (ref 70–99)
Potassium: 4.3 mmol/L (ref 3.5–5.2)
Sodium: 143 mmol/L (ref 134–144)
Total Protein: 7.4 g/dL (ref 6.0–8.5)
eGFR: 81 mL/min/{1.73_m2} (ref >59)

## 2024-04-02 LAB — LIPID PANEL
Chol/HDL Ratio: 2.7 ratio (ref 0.0–5.0)
Cholesterol, Total: 128 mg/dL (ref 100–199)
HDL: 47 mg/dL (ref >39)
LDL Chol Calc (NIH): 68 mg/dL (ref 0–99)
Triglycerides: 59 mg/dL (ref 0–149)
VLDL Cholesterol Cal: 13 mg/dL (ref 5–40)

## 2024-04-02 LAB — HEMOGLOBIN A1C
Est. average glucose Bld gHb Est-mCnc: 140 mg/dL
Hgb A1c MFr Bld: 6.5 % — ABNORMAL HIGH (ref 4.8–5.6)

## 2024-04-07 ENCOUNTER — Encounter: Payer: Self-pay | Admitting: Psychology
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
## 2024-04-07 NOTE — Assessment & Plan Note (Signed)
 Blood pressure borderline in office, did improve some on recheck, still slightly above goal given underlying diabetes.  Patient continues with amlodipine, denies any issues with medication at this time.  Not checking blood pressure regularly at home Recommend monitoring of blood pressure at home, DASH diet No changes to medication regimen today, continue with amlodipine Continue to monitor blood pressure closely determine need for additional agent, likely would consider ACE inhibitor or ARB given underlying diabetes

## 2024-04-07 NOTE — Assessment & Plan Note (Signed)
 Continues to follow-up with orthopedic and pain management specialist.  He is requesting refill of lidocaine  patches today as well as Celebrex .  We will provide refill of these medications for patient.  Recommend continuing close follow-up with orthopedic providers and pain management specialist

## 2024-05-13 ENCOUNTER — Encounter (HOSPITAL_BASED_OUTPATIENT_CLINIC_OR_DEPARTMENT_OTHER): Payer: Self-pay | Admitting: *Deleted

## 2024-07-09 ENCOUNTER — Encounter (HOSPITAL_BASED_OUTPATIENT_CLINIC_OR_DEPARTMENT_OTHER)

## 2025-03-25 ENCOUNTER — Ambulatory Visit: Admitting: Psychology

## 2025-03-30 ENCOUNTER — Ambulatory Visit (HOSPITAL_COMMUNITY): Admitting: Psychiatry

## 2025-04-05 ENCOUNTER — Encounter (HOSPITAL_BASED_OUTPATIENT_CLINIC_OR_DEPARTMENT_OTHER): Admitting: Family Medicine
